# Patient Record
Sex: Male | Born: 1940 | Race: White | Hispanic: No | Marital: Married | Smoking: Former smoker
Health system: Southern US, Community
[De-identification: ages and names within clinical notes are randomized; demographics above are authoritative.]

## PROBLEM LIST (undated history)

## (undated) DIAGNOSIS — M1990 Unspecified osteoarthritis, unspecified site: Secondary | ICD-10-CM

## (undated) DIAGNOSIS — I10 Essential (primary) hypertension: Secondary | ICD-10-CM

## (undated) DIAGNOSIS — I4891 Unspecified atrial fibrillation: Secondary | ICD-10-CM

## (undated) DIAGNOSIS — Z9889 Other specified postprocedural states: Secondary | ICD-10-CM

## (undated) DIAGNOSIS — R112 Nausea with vomiting, unspecified: Secondary | ICD-10-CM

## (undated) DIAGNOSIS — N200 Calculus of kidney: Secondary | ICD-10-CM

## (undated) HISTORY — PX: CARPAL TUNNEL RELEASE: SHX101

## (undated) HISTORY — PX: OTHER SURGICAL HISTORY: SHX169

## (undated) HISTORY — DX: Unspecified atrial fibrillation: I48.91

## (undated) HISTORY — PX: INGUINAL HERNIA REPAIR: SUR1180

## (undated) HISTORY — DX: Calculus of kidney: N20.0

---

## 2004-10-27 ENCOUNTER — Other Ambulatory Visit: Admission: RE | Admit: 2004-10-27 | Discharge: 2004-10-27 | Payer: Self-pay | Admitting: Dermatology

## 2008-03-11 ENCOUNTER — Ambulatory Visit (HOSPITAL_COMMUNITY): Admission: RE | Admit: 2008-03-11 | Discharge: 2008-03-11 | Payer: Self-pay | Admitting: Urology

## 2008-03-13 ENCOUNTER — Ambulatory Visit (HOSPITAL_COMMUNITY): Admission: RE | Admit: 2008-03-13 | Discharge: 2008-03-13 | Payer: Self-pay | Admitting: Urology

## 2008-04-21 ENCOUNTER — Ambulatory Visit (HOSPITAL_COMMUNITY): Admission: RE | Admit: 2008-04-21 | Discharge: 2008-04-21 | Payer: Self-pay | Admitting: Urology

## 2008-11-26 IMAGING — CR DG ABDOMEN 1V
1 series · 1 of 1 positions shown · non-contrast
Comparison: None

CLINICAL DATA: Preop right ureteral calculus.

ABDOMEN - 1 VIEW

[view not recorded]
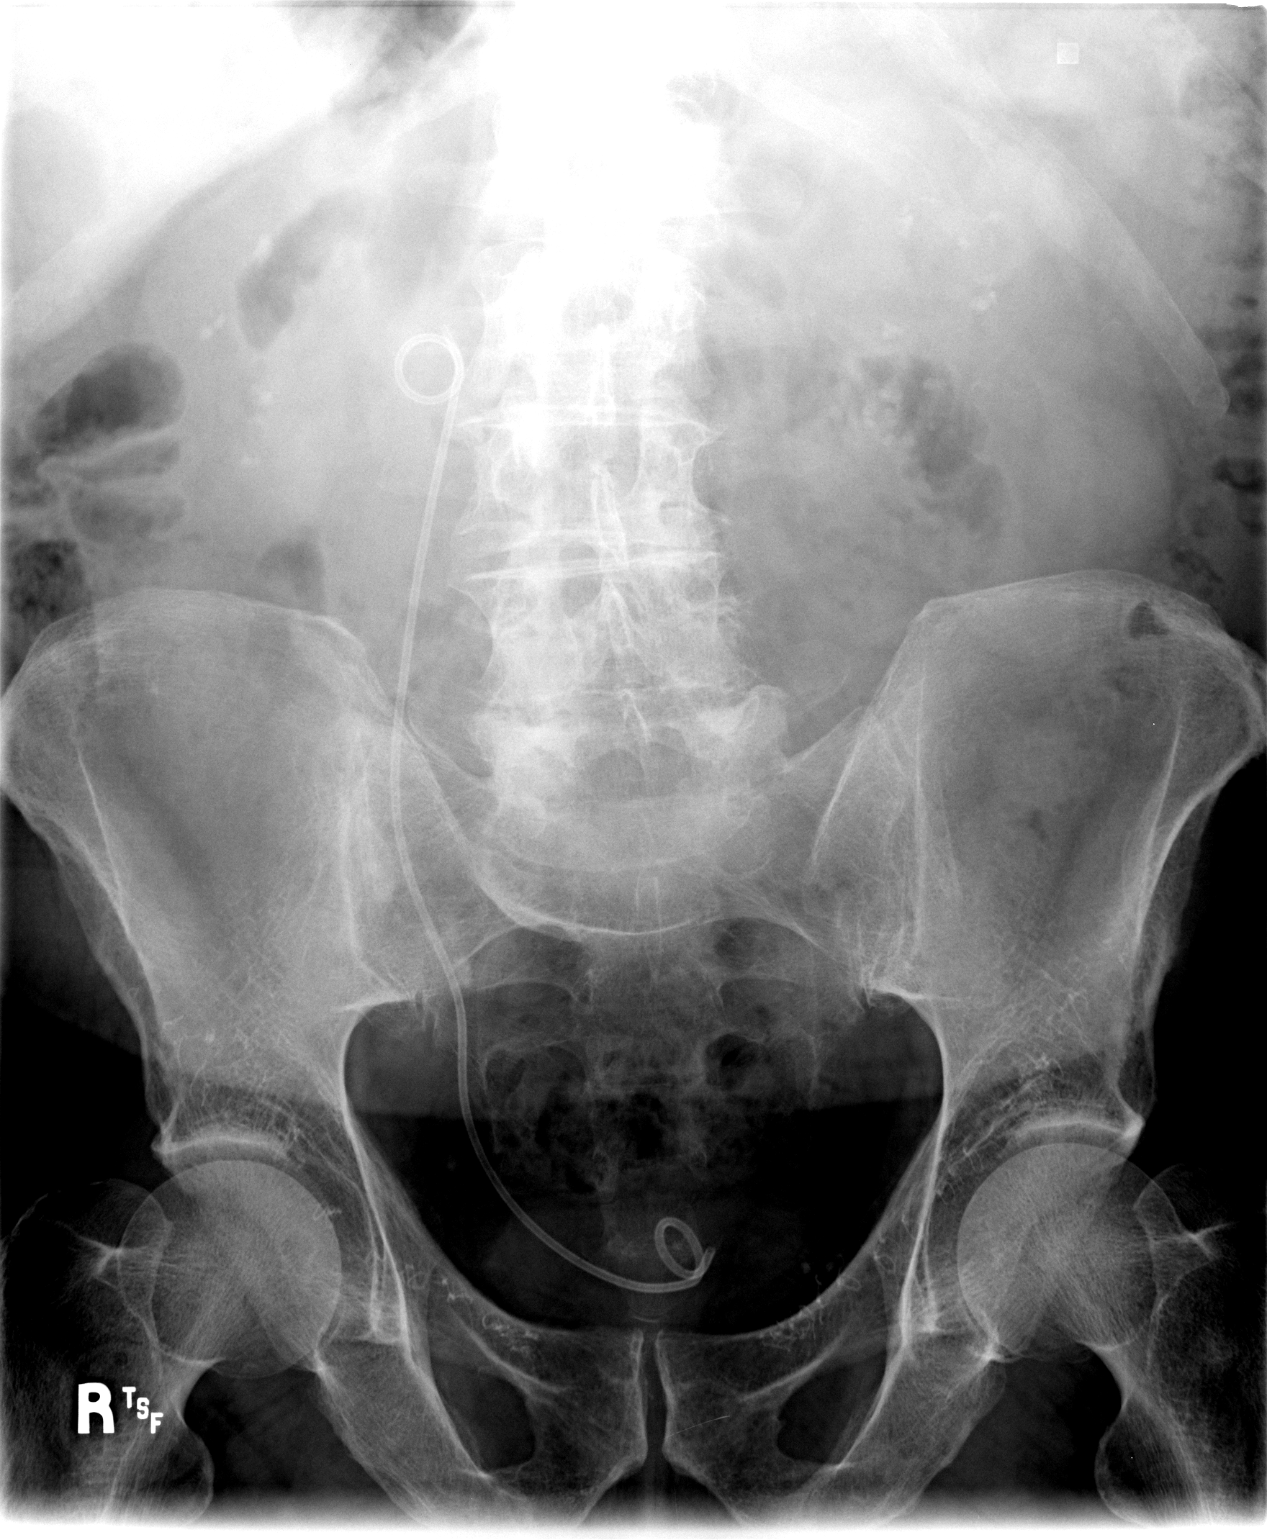

[1 of 1 positions shown; findings below may reference images not displayed]

FINDINGS: There are several radiopaque calculi projecting over the
renal outlines bilaterally.  A double-J right ureteral stent is in
place, with the proximal loop formed in the expected location of
the right renal pelvis and distal loop formed in the expected
location of the bladder.  A 9 mm calcific density is seen along the
distal aspect of the stent.  Degenerative changes are seen in the
spine.
IMPRESSION: 1.  Probable distal right ureteral calculus, with double J ureteral
stent in place.
2.  Bilateral nephrolithiasis.

## 2010-06-30 ENCOUNTER — Encounter: Payer: Self-pay | Admitting: Cardiology

## 2010-07-27 ENCOUNTER — Ambulatory Visit: Payer: Self-pay | Admitting: Cardiology

## 2010-07-27 DIAGNOSIS — I447 Left bundle-branch block, unspecified: Secondary | ICD-10-CM

## 2010-07-27 DIAGNOSIS — I4891 Unspecified atrial fibrillation: Secondary | ICD-10-CM | POA: Insufficient documentation

## 2010-07-29 ENCOUNTER — Encounter: Payer: Self-pay | Admitting: Physician Assistant

## 2010-07-29 ENCOUNTER — Ambulatory Visit: Payer: Self-pay | Admitting: Cardiology

## 2010-08-16 ENCOUNTER — Ambulatory Visit (HOSPITAL_COMMUNITY): Admission: RE | Admit: 2010-08-16 | Payer: Self-pay | Source: Home / Self Care | Admitting: Cardiology

## 2010-08-16 ENCOUNTER — Telehealth: Payer: Self-pay | Admitting: Cardiology

## 2010-08-16 ENCOUNTER — Ambulatory Visit: Admit: 2010-08-16 | Payer: Self-pay | Admitting: Cardiology

## 2010-08-22 ENCOUNTER — Ambulatory Visit (HOSPITAL_COMMUNITY)
Admission: RE | Admit: 2010-08-22 | Discharge: 2010-08-22 | Payer: Self-pay | Source: Home / Self Care | Attending: Cardiology | Admitting: Cardiology

## 2010-08-22 ENCOUNTER — Ambulatory Visit: Admission: RE | Admit: 2010-08-22 | Discharge: 2010-08-22 | Payer: Self-pay | Source: Home / Self Care

## 2010-08-29 ENCOUNTER — Encounter: Payer: Self-pay | Admitting: Cardiology

## 2010-08-31 ENCOUNTER — Encounter: Payer: Self-pay | Admitting: Cardiology

## 2010-08-31 ENCOUNTER — Ambulatory Visit: Admission: RE | Admit: 2010-08-31 | Discharge: 2010-08-31 | Payer: Self-pay | Source: Home / Self Care

## 2010-08-31 DIAGNOSIS — I428 Other cardiomyopathies: Secondary | ICD-10-CM | POA: Insufficient documentation

## 2010-09-02 ENCOUNTER — Ambulatory Visit (HOSPITAL_COMMUNITY)
Admission: RE | Admit: 2010-09-02 | Discharge: 2010-09-02 | Payer: Self-pay | Source: Home / Self Care | Admitting: Cardiology

## 2010-09-02 ENCOUNTER — Telehealth: Payer: Self-pay | Admitting: Cardiology

## 2010-09-03 NOTE — Op Note (Signed)
  Trevor Gallagher, ORILEY NO.:  [id number]  MEDICAL RECORD NO.:  [id number]          PATIENT TYPE:  OIB  LOCATION:  2899                         FACILITY:  [facility]  PHYSICIAN:  Luis Abed, MD, FACCDATE OF BIRTH:  06-25-1941  DATE OF PROCEDURE:  09/02/2010 DATE OF DISCHARGE:                              OPERATIVE REPORT   CARDIOVERSION REPORT  The patient was very carefully anticoagulated with Pradaxa.  He has atrial fibrillation.  His labs were checked and they were quite stable.  Anesthesia was present.  The patient received 16 mg of etomidate. Anterior-posterior pads were placed.  He received biphasic energy.  He was given 1 shock with 120 joules and converted to sinus rhythm.  He is waking up rapidly.  His family is here.  When he is fully awake, he will be allowed to go home and return for followup with Dr. Antoine Poche.     Luis Abed, MD, [facility]     JDK/MEDQ  D:  09/02/2010  T:  09/02/2010  Job:  [id number]  cc:   Rollene Rotunda, MD, [facility]  Electronically Signed by Willa Rough MD [facility] on 09/03/2010 09:10:38 AM

## 2010-09-15 NOTE — Miscellaneous (Signed)
  Clinical Lists Changes  Observations: Added new observation of ECHOINTERP: - Left ventricle: The cavity size was mildly dilated. There was mild       concentric hypertrophy. Systolic function was severely reduced.       The estimated ejection fraction was in the range of 25% to 30%.       Diffuse hypokinesis. The study is not technically sufficient to       allow evaluation of LV diastolic function.     - Ventricular septum: Septal motion showed abnormal function and       dyssynergy.     - Aortic valve: Mild regurgitation.     - Left atrium: The atrium was moderately dilated.     - Right atrium: The atrium was mildly dilated. (08/22/2010 15:12)      Echocardiogram  Procedure date:  08/22/2010  Findings:      - Left ventricle: The cavity size was mildly dilated. There was mild       concentric hypertrophy. Systolic function was severely reduced.       The estimated ejection fraction was in the range of 25% to 30%.       Diffuse hypokinesis. The study is not technically sufficient to       allow evaluation of LV diastolic function.     - Ventricular septum: Septal motion showed abnormal function and       dyssynergy.     - Aortic valve: Mild regurgitation.     - Left atrium: The atrium was moderately dilated.     - Right atrium: The atrium was mildly dilated.

## 2010-09-15 NOTE — Assessment & Plan Note (Signed)
Summary: [facility]   Visit Type:  Follow-up Primary Provider:  Dr. Vernon Prey  CC:  Cardiomyopathy and atrial fibrillation.  History of Present Illness: The patient presents for followup of atrial fibrillation. Following this new diagnosis I also found on echocardiography and a diagnosis of an EF of 25% with global hypokinesis. He has been taking Pradaxa since the last appointment.  He does feel an irregular rate occasionally. He denies any acute shortness of breath such as PND or orthopnea. He will get dyspneic if he does significant activities. He denies any chest discomfort, neck or arm discomfort. He has had no presyncope or syncope. He has had no weight gain or edema.  Of note Holter monitoring demonstrated persistent atrial with rates as low as 58 and as high as the 150s.  Current Medications (verified): 1)  Pradaxa 150 Mg Caps (Dabigatran Etexilate Mesylate) .... Take 1 Tablet By Mouth Two Times A Day 2)  Metoprolol Tartrate 25 Mg Tabs (Metoprolol Tartrate) .... 1/2 By Mouth Two Times A Day  Allergies (verified): No Known Drug Allergies  Past History:  Past Medical History: Nephrolithiasis Cardiomyopathy Atrial fibrillation  Past Surgical History: Reviewed history from 07/27/2010 and no changes required. Carpal Tunnel Left HNP L4 - L5 Bilateral inguinal   Family History: Reviewed history from 07/27/2010 and no changes required. No early heart disease or arrhythmias.  Social History: Patient is a Visual merchandiser though semiretired.  He is married with two children.  He had a 20 pack year smoking history though he quit in 1976.  Review of Systems       As stated in the HPI and negative for all other systems.   Vital Signs:  Patient profile:   70 year old male Height:      73 inches Weight:      226 pounds BMI:     29.92 Pulse rate:   97 / minute Resp:     16 per minute BP sitting:   120 / 88  (right arm)  Vitals Entered By: Marrion Coy, CNA (August 31, 2010 3:29 PM)  Physical Exam  General:  Well developed, well nourished, in no acute distress. Head:  normocephalic and atraumatic Eyes:  PERRLA/EOM intact; conjunctiva and lids normal. Mouth:  Upper dentures. Oral mucosa normal. Neck:  Neck supple, no JVD. No masses, thyromegaly or abnormal cervical nodes. Chest Wall:  no deformities or breast masses noted Lungs:  Clear bilaterally to auscultation and percussion. Abdomen:  Bowel sounds positive; abdomen soft and non-tender without masses, organomegaly, or hernias noted. No hepatosplenomegaly. Msk:  Back normal, normal gait. Muscle strength and tone normal. Extremities:  No clubbing or cyanosis. Neurologic:  Alert and oriented x 3. Skin:  Intact without lesions or rashes. Cervical Nodes:  no significant adenopathy Axillary Nodes:  no significant adenopathy Inguinal Nodes:  no significant adenopathy Psych:  Normal affect.   Detailed Cardiovascular Exam  Neck    Carotids: Carotids full and equal bilaterally without bruits.      Neck Veins: Normal, no JVD.    Heart    Inspection: no deformities or lifts noted.      Palpation: normal PMI with no thrills palpable.      Auscultation: irregular rate and rhythm, S1, S2 without murmurs, rubs, gallops, or clicks.    Vascular    Abdominal Aorta: no palpable masses, pulsations, or audible bruits.      Femoral Pulses: normal femoral pulses bilaterally.      Pedal Pulses: normal pedal pulses  bilaterally.      Radial Pulses: normal radial pulses bilaterally.      Peripheral Circulation: no clubbing, cyanosis, or edema noted with normal capillary refill.     Impression & Recommendations:  Problem # 1:  ATRIAL FIBRILLATION (ICD-427.31) The patient has persistent fibrillation. He has been on Pradaxa for greater than three weeks.  He will be scheduled for DCCV.   Problem # 2:  CARDIOMYOPATHY (ICD-425.4) This might be rate. He has absolutely no symptoms consistent with chest pain.  He is  very reluctant to have any therapies.  I think that he will let me increase his metorpolol to 25 mg two times a day.  I wll next try to use an ACE inhibitor.  If his EF does not improve with rhtythm or rate control he will need a cath or functional study to rule out ischemia.  Problem # 3:  LBBB (ICD-426.3) Assessment: Unchanged  His updated medication list for this problem includes:    Metoprolol Tartrate 25 Mg Tabs (Metoprolol tartrate) .Marland Kitchen... Twice a day  Patient Instructions: 1)  Your physician recommends that you schedule a follow-up appointment after your cardioversion 2)  Your physician recommends that you have lab work today:  BMP, CBC and TSH 3)  Your physician has recommended that you have a cardioversion (DCCV).  Electrical cardioversion uses a jolt of electricity to your heart either through paddles or wired patches attached to your chest. This is a controlled, usually prescheduled, procedure. Defibrillation is done under light anesthesia in the hospital, and you usually go home the day of the procedure. This is done to get your heart back into a normal rhythm. You are not awake for the procedure. Please see the instruction sheet given to you today. 4)  Your physician has recommended you make the following change in your medication: increase Metoprolol to 25 mg twice a day Prescriptions: METOPROLOL TARTRATE 25 MG TABS (METOPROLOL TARTRATE) twice a day  #60 x 6   Entered by:   Charolotte Capuchin, RN   Authorized by:   Rollene Rotunda, MD, [facility]   Signed by:   Charolotte Capuchin, RN on 08/31/2010   Method used:   [facility]        [facility] [phone]* (retail)       [address]       [place], [place]  [address]       Ph: [phone] or [phone]       Fax: [phone]   RxID:   [id number]

## 2010-09-15 NOTE — Progress Notes (Signed)
Summary: discuss monitor results  Phone Note Call from Patient Call back at Home Phone [phone]   Caller: Spouse/darlene Reason for Call: Talk to Nurse, Lab or Test Results Summary of Call: pt wife calling re monitor results Initial call taken by: Roe Coombs,  August 16, 2010 11:17 AM  Follow-up for Phone Call        called and left message that [facility] office had already reviewed with pt but to call back if he has further questions  Pt wife wish to talk to nurse here at this office Judie Grieve  August 16, 2010 4:33 PM Follow-up by: Charolotte Capuchin, RN,  August 16, 2010 3:04 PM  Additional Follow-up for Phone Call Additional follow up Details #1::        Reviewed in detail at fib and its treatments with pt's wife.  call lasted greater than 15 mins.  Appt was schedule for pt to be seen in the [place] office 08/31/2010 at 3:15 pm  to discuss treatment plan with Dr Antoine Poche at the wife's request. Additional Follow-up by: Charolotte Capuchin, RN,  August 16, 2010 5:34 PM

## 2010-09-15 NOTE — Progress Notes (Signed)
Summary: re  CARDIOVERSION  Phone Note Call from Patient Call back at Home Phone [phone]   Caller: Spouse Reason for Call: Talk to Nurse Summary of Call: pt wife wants to know what the next step and what kind of meds does the pt need. pt had a CARDIOVERSION today. Initial call taken by: Roe Coombs,  September 02, 2010 2:57 PM  Follow-up for Phone Call        follow up scheduled in [facility] office 12N 09/21/2010.  Wife aware of appt and to continue medications as ordered. Follow-up by: Charolotte Capuchin, RN,  September 02, 2010 3:08 PM

## 2010-09-15 NOTE — Procedures (Signed)
Summary: HOLTER MONITOR  HOLTER MONITOR   Imported By: Cyril Loosen, RN, BSN 08/12/2010 15:38:23  _____________________________________________________________________  External Attachment:    Type:   Image     Comment:   External Document  Appended Document: HOLTER MONITOR Left message to call back w/pt's son.  Appended Document: HOLTER MONITOR patient left message at 4:37 12/30 returning your call  patient left message at 9:28 01/03 returning your call  patient left message at 10:15 01/03  returning call  Appended Document: HOLTER MONITOR Pt notified of results and verbalized understanding. Monitor results will be sent to Dr. Antoine Poche for further review as this is a [place] pt.

## 2010-09-15 NOTE — Assessment & Plan Note (Signed)
Summary: Trevor Gallagher   Visit Type:  Follow-up Primary Provider:  Dr. Vernon Prey  CC:  Alberteen Sam Fibrillation.  History of Present Illness: The patient presents for evaluation of an abnormal EKG. He was found recently at time of hand surgery to be in atrial fibrillation. The patient does not feel this rhythm. He has not had palpitations, presyncope or syncope. He does not have lightheadedness other than mild orthostasis rarely. He denies any chest pressure, neck or arm discomfort. He's not had any shortness of breath, PND or orthopnea. He does try to stay active. He said no cardiac testing in the past.  Current Medications (verified): 1)  Aleve 220 Mg Tabs (Naproxen Sodium) .... Prn  Allergies (verified): No Known Drug Allergies  Past History:  Past Medical History: Nephrolithiasis  Past Surgical History: Carpal Tunnel Left HNP L4 - L5 Bilateral inguinal   Family History: No early heart disease or arrhythmias.  Social History: Patient is a former though semiretired.  He is married with two children.  He had a 20 pack year smoking history though he quit in 1976.  Review of Systems       Joint pain, colitis.  Otherwise negative for all other systems.  Vital Signs:  Patient profile:   70 year old male Height:      73 inches Weight:      225 pounds BMI:     29.79 Pulse rate:   62 / minute Resp:     16 per minute BP sitting:   132 / 68  (right arm)  Vitals Entered By: Marrion Coy, CNA (July 27, 2010 1:16 PM)  Physical Exam  General:  Well developed, well nourished, in no acute distress. Head:  normocephalic and atraumatic Eyes:  PERRLA/EOM intact; conjunctiva and lids normal. Mouth:  Upper dentures. Oral mucosa normal. Neck:  Neck supple, no JVD. No masses, thyromegaly or abnormal cervical nodes. Chest Wall:  no deformities or breast masses noted Lungs:  Clear bilaterally to auscultation and percussion. Abdomen:  Bowel sounds positive; abdomen soft and  non-tender without masses, organomegaly, or hernias noted. No hepatosplenomegaly. Msk:  Back normal, normal gait. Muscle strength and tone normal. Extremities:  No clubbing or cyanosis. Neurologic:  Alert and oriented x 3. Skin:  Intact without lesions or rashes. Cervical Nodes:  no significant adenopathy Axillary Nodes:  no significant adenopathy Inguinal Nodes:  no significant adenopathy Psych:  Normal affect.   Detailed Cardiovascular Exam  Neck    Carotids: Carotids full and equal bilaterally without bruits.      Neck Veins: Normal, no JVD.    Heart    Inspection: no deformities or lifts noted.      Palpation: normal PMI with no thrills palpable.      Auscultation: irregular rate and rhythm, S1, S2 without murmurs, rubs, gallops, or clicks.    Vascular    Abdominal Aorta: no palpable masses, pulsations, or audible bruits.      Femoral Pulses: normal femoral pulses bilaterally.      Pedal Pulses: normal pedal pulses bilaterally.      Radial Pulses: normal radial pulses bilaterally.      Peripheral Circulation: no clubbing, cyanosis, or edema noted with normal capillary refill.     EKG  Procedure date:  06/30/2010  Findings:      Atrial fibrillation, LBBB  Impression & Recommendations:  Problem # 1:  ATRIAL FIBRILLATION (ICD-427.31) I would like to try the patient back to normal rhythm. For that he'll need to be  on anticoagulation and I think Pradaxa will allow this to be a short course and least problematic.  I see no contraindications. I will also check a Holter monitor to make sure this is permanent atrial fibrillation. I would then bring him back in 4 weeks for elective cardioversion. Orders: Echocardiogram (Echo) Holter (Holter)  Problem # 2:  LBBB (ICD-426.3) I will check an echocardiogram. Further plans are as above.  Patient Instructions: 1)  Start Pradaxa 150mg  two times a day  2)  Your physician has requested that you have an echocardiogram.   Echocardiography is a painless test that uses sound waves to create images of your heart. It provides your doctor with information about the size and shape of your heart and how well your heart's chambers and valves are working.  This procedure takes approximately one hour. There are no restrictions for this procedure. 3)  Your physician has recommended that you wear a holter monitor.  Holter monitors are medical devices that record the heart's electrical activity. Doctors most often use these monitors to diagnose arrhythmias. Arrhythmias are problems with the speed or rhythm of the heartbeat. The monitor is a small, portable device. You can wear one while you do your normal daily activities. This is usually used to diagnose what is causing palpitations/syncope (passing out). 4)  You have been diagnosed with atrial fibrillation.  Atrial fibrillation is a condition in which one of the upper chambers of the heart has extra electrical cells causing it to beat very fast.  Please see the handout/brochure given to you today for further information. 5)  Your physician has recommended that you have a cardioversion (DCCV).  Electrical cardioversion uses a jolt of electricity to your heart either through paddles or wired patches attached to your chest. This is a controlled, usually prescheduled, procedure. Defibrillation is done under light anesthesia in the hospital, and you usually go home the day of the procedure. This is done to get your heart back into a normal rhythm. You are not awake for the procedure. Please see the instruction sheet given to you today. Prescriptions: PRADAXA 150 MG CAPS (DABIGATRAN ETEXILATE MESYLATE) Take 1 tablet by mouth two times a day  #60 x 3   Entered by:   Meredith Staggers, RN   Authorized by:   Rollene Rotunda, MD, [facility]   Signed by:   Meredith Staggers, RN on 07/27/2010   Method used:   [facility]        [facility] [phone]* (retail)       [address]        [place], [place]  [address]       Ph: [phone] or [phone]       Fax: [phone]   RxID:   [id number]    Appended Document: Flanagan Gallagher spoke w/Jenny in [place] office pt is sch to get 48 hour holter on Fri 12/16 at 9:15, pt is aware

## 2010-09-15 NOTE — Letter (Signed)
Summary: Financial planner at [facility]. [address]   [place], [place] [address]   Phone: [phone]  Fax: [phone]    Cardioversion / TEE Cardioversion Instructions  08/31/2010 MRN: [id number]  Trevor Gallagher [address] [place], [place]  [address]  Dear Trevor Gallagher, You are scheduled for a Cardioversion / TEE Cardioversion on Friday, January 20, 2012_ with Dr. Jerral Bonito   Please arrive at the [facility] at 10 a.m.  on the day of your procedure.  1)   DIET:  A)   Nothing to eat or drink after midnight except your medications with a sip of water.   B)   YOU MAY TAKE ALL of your remaining medications with a small amount of water.    2)  Must have a responsible person to drive you home.  3)   Bring a current list of your medications and current insurance cards.   * Special Note:  Every effort is made to have your procedure done on time. Occasionally there are emergencies that present themselves at the hospital that may cause delays. Please be patient if a delay does occur.  * If you have any questions after you get home, please call the office at [phone].

## 2010-09-21 ENCOUNTER — Encounter: Payer: Self-pay | Admitting: Cardiology

## 2010-09-21 ENCOUNTER — Ambulatory Visit (INDEPENDENT_AMBULATORY_CARE_PROVIDER_SITE_OTHER): Payer: Medicare Other | Admitting: Cardiology

## 2010-09-21 DIAGNOSIS — I4891 Unspecified atrial fibrillation: Secondary | ICD-10-CM

## 2010-09-29 NOTE — Assessment & Plan Note (Signed)
Summary: F/U S/P CARDIOVERSION PF, RN   Visit Type:  Follow-up Primary Provider:  Dr. Vernon Prey  CC:  Cardiomyopathy and Atrial Fibrillation.  History of Present Illness: The patient presents for followup of the above. He is status post cardioversion but unfortunately has obviously back into fibrillation. He doesn't really feel this and wouldn't kno that  he is in his rhythm.  He denies presyncope or syncope. He has had a cough and has recently been diagnosed with a pneumonia. Apparently there is an x-ray though I don't have these results. He is being treated with antibiotics and this is improving. He does apparently describe some orthopnea. He is not having any chest pressure, neck or arm discomfort. He said no weight gain or edema.  Current Medications (verified): 1)  Pradaxa 150 Mg Caps (Dabigatran Etexilate Mesylate) .... Take 1 Tablet By Mouth Two Times A Day 2)  Metoprolol Tartrate 25 Mg Tabs (Metoprolol Tartrate) .... Twice A Day 3)  Azithromycin 250 Mg Tabs (Azithromycin) .... As Directed 4)  Cefdinir 300 Mg Caps (Cefdinir) .Marland Kitchen.. 1 By Mouth Two Times A Day  Allergies (verified): No Known Drug Allergies  Past History:  Past Medical History: Nephrolithiasis Cardiomyopathy (25%) Atrial fibrillation (recurrent after DC cardioversion)  Past Surgical History: Reviewed history from 07/27/2010 and no changes required. Carpal Tunnel Left HNP L4 - L5 Bilateral inguinal   Review of Systems       As stated in the HPI and negative for all other systems.   Vital Signs:  Patient profile:   70 year old male Height:      73 inches Weight:      222 pounds BMI:     29.40 Pulse rate:   93 / minute Resp:     16 per minute BP sitting:   130 / 102  (right arm)  Vitals Entered By: Marrion Coy, CNA (September 21, 2010 12:14 PM)  Physical Exam  General:  Well developed, well nourished, in no acute distress. Head:  normocephalic and atraumatic Eyes:  PERRLA/EOM intact; conjunctiva  and lids normal. Mouth:  Upper dentures. Oral mucosa normal. Neck:  Neck supple, no JVD. No masses, thyromegaly or abnormal cervical nodes. Chest Wall:  no deformities or breast masses noted Lungs:  Clear bilaterally to auscultation and percussion. Abdomen:  Bowel sounds positive; abdomen soft and non-tender without masses, organomegaly, or hernias noted. No hepatosplenomegaly. Msk:  Back normal, normal gait. Muscle strength and tone normal. Extremities:  No clubbing or cyanosis. Neurologic:  Alert and oriented x 3. Skin:  Intact without lesions or rashes. Cervical Nodes:  no significant adenopathy Axillary Nodes:  no significant adenopathy Inguinal Nodes:  no significant adenopathy Psych:  Normal affect.   Detailed Cardiovascular Exam  Neck    Carotids: Carotids full and equal bilaterally without bruits.      Neck Veins: Normal, no JVD.    Heart    Inspection: no deformities or lifts noted.      Palpation: normal PMI with no thrills palpable.      Auscultation: irregular rate and rhythm, S1, S2 without murmurs, rubs, gallops, or clicks.    Vascular    Abdominal Aorta: no palpable masses, pulsations, or audible bruits.      Femoral Pulses: normal femoral pulses bilaterally.      Pedal Pulses: normal pedal pulses bilaterally.      Radial Pulses: normal radial pulses bilaterally.      Peripheral Circulation: no clubbing, cyanosis, or edema noted with normal capillary  refill.     Impression & Recommendations:  Problem # 1:  CARDIOMYOPATHY (ICD-425.4) Today underwent to titrate meds to further manage his cardiomyopathy. The etiology is still not clear and he will likely need an ischemia workup if he will allow me in the future. I am going to change from metoprolol to carvedilol 3.125 mg b.i.d. and lisinopril 5 mg daily.  I told him he will need frequent visits and titrations.  Problem # 2:  ATRIAL FIBRILLATION (ICD-427.31) For now he will remain on Pradaxa and we will try for  rate control.  He might need Tikosyn in the future and another cardioversion.  However, he is very reluctant to proceed with these therapies. Orders: EKG w/ Interpretation (93000)  Problem # 3:  LBBB (ICD-426.3) This is chronic. Orders: EKG w/ Interpretation (93000)  Patient Instructions: 1)  Your physician recommends that you schedule a follow-up appointment in: 2 weeks with Dr Antoine Poche 2)  Your physician recommends that you return for lab work in:  2 weeks BMP  427.31 428.22 3)  Your physician has recommended you make the following change in your medication: Sto[p Metoprolol and start Carvedilol 3.125 mg two times a day and Lisinopril 5 mg once Prescriptions: LISINOPRIL 5 MG TABS (LISINOPRIL) once daily  #30 x 6   Entered by:   Charolotte Capuchin, RN   Authorized by:   Rollene Rotunda, MD, [facility]   Signed by:   Charolotte Capuchin, RN on 09/21/2010   Method used:   [facility] to        [facility] [phone]* (retail)       [address]       [place], [place]  [address]       Ph: [phone] or [phone]       Fax: [phone]   RxID:   [id number] COREG 3.125 MG TABS (CARVEDILOL) one two times a day  #60 x 3   Entered by:   Charolotte Capuchin, RN   Authorized by:   Rollene Rotunda, MD, [facility]   Signed by:   Charolotte Capuchin, RN on 09/21/2010   Method used:   [facility]ically to        [facility] [phone]* (retail)       [address]       [place], [place]  [address]       Ph: [phone] or [phone]       Fax: [phone]   RxID:   [id number]  I have reviewed and approved all prescriptions at the time of this visit. Rollene Rotunda, MD, [facility]  September 21, 2010 1:10 PM

## 2010-10-07 ENCOUNTER — Encounter: Payer: Self-pay | Admitting: Cardiology

## 2010-10-12 ENCOUNTER — Ambulatory Visit (INDEPENDENT_AMBULATORY_CARE_PROVIDER_SITE_OTHER): Payer: Medicare Other | Admitting: Cardiology

## 2010-10-12 ENCOUNTER — Encounter: Payer: Self-pay | Admitting: Cardiology

## 2010-10-12 DIAGNOSIS — I4891 Unspecified atrial fibrillation: Secondary | ICD-10-CM

## 2010-10-12 DIAGNOSIS — I5022 Chronic systolic (congestive) heart failure: Secondary | ICD-10-CM

## 2010-10-14 ENCOUNTER — Encounter (INDEPENDENT_AMBULATORY_CARE_PROVIDER_SITE_OTHER): Payer: Self-pay | Admitting: *Deleted

## 2010-10-20 NOTE — Letter (Signed)
Summary: Appointment - Reschedule  [facility], Main Office  [address]   [place], [place] [address]   Phone: [phone]  Fax: [phone]     October 14, 2010 MRN: [id number]   SEVERO BEBER [address] [place], [place]  [address]   Dear Mr. ESCHER,   Due to a change in our office schedule, your appointment on  11-16-2010                      at   1:30 p.m.             must be changed.  It is very important that we reach you to reschedule this appointment. We look forward to participating in your health care needs. Please contact us at the number listed above at your earliest convenience to reschedule this appointment.     Sincerely,      Lorne Skeens  [facility] Scheduling Team

## 2010-10-20 NOTE — Assessment & Plan Note (Signed)
Summary: 2 weeks with Dr Antoine Poche   Visit Type:  Follow-up Primary Provider:  Dr. Vernon Prey  CC:  Cardiomyopathy.  History of Present Illness: The patient presents for evaluation of cardiomyopathy and atrial fibrillation. At the last appointment he was back in fibrillation after cardioversion. I switched him to carvedilol and lisinopril. He tolerated these. He has not had any new lightheadedness, presyncope or syncope. He's not had any new chest pressure, neck or arm discomfort. He has not had any new palpitations. He denies any shortness of breath, PND or orthopnea.  Current Medications (verified): 1)  Pradaxa 150 Mg Caps (Dabigatran Etexilate Mesylate) .... Take 1 Tablet By Mouth Two Times A Day 2)  Azithromycin 250 Mg Tabs (Azithromycin) .... As Directed 3)  Coreg 3.125 Mg Tabs (Carvedilol) .... One Two Times A Day 4)  Lisinopril 5 Mg Tabs (Lisinopril) .... Once Daily  Allergies (verified): No Known Drug Allergies  Past History:  Past Medical History: Reviewed history from 09/21/2010 and no changes required. Nephrolithiasis Cardiomyopathy (25%) Atrial fibrillation (recurrent after DC cardioversion)  Past Surgical History: Reviewed history from 07/27/2010 and no changes required. Carpal Tunnel Left HNP L4 - L5 Bilateral inguinal   Review of Systems       As stated in the HPI and negative for all other systems.   Vital Signs:  Patient profile:   70 year old male Height:      73 inches Weight:      224 pounds BMI:     29.66 Pulse rate:   79 / minute Resp:     16 per minute BP sitting:   114 / 76  (right arm)  Vitals Entered By: Marrion Coy, CNA (October 12, 2010 1:24 PM)  Physical Exam  General:  Well developed, well nourished, in no acute distress. Head:  normocephalic and atraumatic Eyes:  PERRLA/EOM intact; conjunctiva and lids normal. Mouth:  Upper dentures. Oral mucosa normal. Neck:  Neck supple, no JVD. No masses, thyromegaly or abnormal cervical  nodes. Chest Wall:  no deformities or breast masses noted Lungs:  Clear bilaterally to auscultation and percussion. Abdomen:  Bowel sounds positive; abdomen soft and non-tender without masses, organomegaly, or hernias noted. No hepatosplenomegaly. Msk:  Back normal, normal gait. Muscle strength and tone normal. Extremities:  No clubbing or cyanosis. Neurologic:  Alert and oriented x 3. Skin:  Intact without lesions or rashes. Cervical Nodes:  no significant adenopathy Inguinal Nodes:  no significant adenopathy Psych:  Normal affect.   Detailed Cardiovascular Exam  Neck    Carotids: Carotids full and equal bilaterally without bruits.      Neck Veins: Normal, no JVD.    Heart    Inspection: no deformities or lifts noted.      Palpation: normal PMI with no thrills palpable.      Auscultation: irregular rate and rhythm, S1, S2 without murmurs, rubs, gallops, or clicks.    Vascular    Abdominal Aorta: no palpable masses, pulsations, or audible bruits.      Femoral Pulses: normal femoral pulses bilaterally.      Pedal Pulses: normal pedal pulses bilaterally.      Radial Pulses: normal radial pulses bilaterally.      Peripheral Circulation: no clubbing, cyanosis, or edema noted with normal capillary refill.     Impression & Recommendations:  Problem # 1:  CARDIOMYOPATHY (ICD-425.4) His management is complicated by his significant grumpiness.  However, I think that I am slowing working my way through this.  He  does agree to take a higher dose of carvedilol. In the future I would like to try to talk him into the hospital where I could try Tikosyn and another DCCV.  Problem # 2:  ATRIAL FIBRILLATION (ICD-427.31) He remains on Pradaxa and I wil continue the meds as listed.  Problem # 3:  LBBB (ICD-426.3) Assessment: Unchanged  Patient Instructions: 1)  Your physician recommends that you schedule a follow-up appointment in: one month 2)  Your physician has recommended you make the  following change in your medication: Increase carvedilol to 6.25 mg two times a day Prescriptions: CARVEDILOL 6.25 MG TABS (CARVEDILOL) one two times a day  #60 x 6   Entered by:   Charolotte Capuchin, RN   Authorized by:   Rollene Rotunda, MD, [facility]   Signed by:   Charolotte Capuchin, RN on 10/12/2010   Method used:   Electronically to        [facility] [phone]* (retail)       [address]       [place], [place]  [address]       Ph: [phone] or [phone]       Fax: [phone]   RxID:   [id number]

## 2010-11-05 ENCOUNTER — Encounter: Payer: Self-pay | Admitting: Cardiology

## 2010-11-16 ENCOUNTER — Ambulatory Visit: Payer: Medicare Other | Admitting: Cardiology

## 2010-11-30 ENCOUNTER — Encounter: Payer: Self-pay | Admitting: Cardiology

## 2010-11-30 ENCOUNTER — Ambulatory Visit (INDEPENDENT_AMBULATORY_CARE_PROVIDER_SITE_OTHER): Payer: Medicare Other | Admitting: Cardiology

## 2010-11-30 VITALS — BP 151/90 | HR 80 | Ht 73.0 in | Wt 222.0 lb

## 2010-11-30 DIAGNOSIS — I428 Other cardiomyopathies: Secondary | ICD-10-CM

## 2010-11-30 DIAGNOSIS — N200 Calculus of kidney: Secondary | ICD-10-CM | POA: Insufficient documentation

## 2010-11-30 DIAGNOSIS — I10 Essential (primary) hypertension: Secondary | ICD-10-CM | POA: Insufficient documentation

## 2010-11-30 DIAGNOSIS — I4891 Unspecified atrial fibrillation: Secondary | ICD-10-CM

## 2010-11-30 DIAGNOSIS — I447 Left bundle-branch block, unspecified: Secondary | ICD-10-CM

## 2010-11-30 MED ORDER — DABIGATRAN ETEXILATE MESYLATE 150 MG PO CAPS
150.0000 mg | ORAL_CAPSULE | Freq: Two times a day (BID) | ORAL | Status: DC
Start: 2010-11-30 — End: 2013-04-11

## 2010-11-30 MED ORDER — OXYCODONE-ACETAMINOPHEN 7.5-500 MG PO TABS: 1.0000 | ORAL_TABLET | Freq: Two times a day (BID) | ORAL | Status: AC | PRN

## 2010-11-30 MED ORDER — LISINOPRIL 10 MG PO TABS
10.0000 mg | ORAL_TABLET | Freq: Every day | ORAL | Status: DC
Start: 2010-11-30 — End: 2011-11-17

## 2010-11-30 MED ORDER — CARVEDILOL 3.125 MG PO TABS: 3.1250 mg | ORAL_TABLET | Freq: Two times a day (BID) | ORAL | Status: DC

## 2010-11-30 NOTE — Assessment & Plan Note (Signed)
I will increase his lisinopril to 10 mg daily. He is very hesitant to have medication changes but I think he's been compliant.

## 2010-11-30 NOTE — Assessment & Plan Note (Signed)
I have encouraged him to see his urologist or primary provider but he will not. I will give him a short course of pain medication.

## 2010-11-30 NOTE — Progress Notes (Signed)
HPI The patient presents for followup of cardiomyopathy and atrial fibrillation. Since I last saw him he has developed a kidney stone which he had done several times in the past. He refuses to see a doctor and says this will pass. He is having some discomfort as he usually does with this though he is not noticing any hematuria. He has had some dizziness and lightheadedness since I increased his carvedilol he has not had frank syncope. He denies any new shortness of breath, PND or cough. He has had no palpitations, presyncope or syncope. He has had no chest pressure, neck or arm discomfort. He has had no weight gain or edema.  No Known Allergies  Current Outpatient Prescriptions  Medication Sig Dispense Refill  . carvedilol (COREG) 6.25 MG tablet Take 6.25 mg by mouth 2 (two) times daily with a meal.        . dabigatran (PRADAXA) 150 MG CAPS Take 150 mg by mouth every 12 (twelve) hours.        Marland Kitchen lisinopril (PRINIVIL,ZESTRIL) 5 MG tablet Take 5 mg by mouth daily.          Past Medical History  Diagnosis Date  . Nephrolithiasis   . Cardiomyopathy   . Atrial fibrillation     s/p cardioversion -- recurrent    Past Surgical History  Procedure Date  . Carpal tunnel release     left  . Inguinal hernia repair     bilateral  . Herniated nucleus pulposus     L4-L5   ROS: As stated in the HPI and negative for all other systems.  PHYSICAL EXAM BP 151/90  Pulse 80  Ht 6\' 1"  (1.854 m)  Wt 222 lb (100.699 kg)  BMI 29.29 kg/m2 GENERAL:  Well appearing HEENT:  Pupils equal round and reactive, fundi not visualized, oral mucosa unremarkable, edentulous NECK:  No jugular venous distention, waveform within normal limits, carotid upstroke brisk and symmetric, no bruits, no thyromegaly LYMPHATICS:  No cervical, inguinal adenopathy LUNGS:  Clear to auscultation bilaterally BACK:  No CVA tenderness CHEST:  Unremarkable HEART:  PMI not displaced or sustained,S1 and S2 within normal limits, no S3, no  S4, no clicks, no rubs, no murmurs, irregular ABD:  Flat, positive bowel sounds normal in frequency in pitch, no bruits, no rebound, no guarding, no midline pulsatile mass, no hepatomegaly, no splenomegaly EXT:  2 plus pulses throughout, no edema, no cyanosis no clubbing SKIN:  No rashes no nodules NEURO:  Cranial nerves II through XII grossly intact, motor grossly intact throughout PSYCH:  Cognitively intact, oriented to person place and time  EKG:  Atrial fibrillation, left bundle branch block, premature ventricular contractions, slow ventricular rate  ASSESSMENT AND PLAN

## 2010-11-30 NOTE — Assessment & Plan Note (Addendum)
It has been difficult to convince him to his medications and the risk of stroke I think she needs to be compliant with Pradaxa.  He understands if he sees any hematuria needs to let me know and I would probably hold throughout. We will get a CBC today. There have been no bleeding issues or complications yet. I would like to think about cardioversion with Tikosyn in in the future but I don't think he'll allow this. He did not maintain sinus rhythm after his last cardioversion.

## 2010-11-30 NOTE — Assessment & Plan Note (Signed)
I don't think he is tolerating the increased carvedilol and I will switch to 3.125 b.i.d. I will titrate his ACE inhibitor as below.

## 2010-11-30 NOTE — Patient Instructions (Signed)
Increase Lisinopril to 10 mg  Daily Decrease Carvedilol to 3.125 mg twice a day Have lab work drawn now (CBC) You may take oxycodone twice a day for pain Follow up with Dr Antoine Poche in [place] in 6 weeks

## 2010-12-01 ENCOUNTER — Encounter: Payer: Self-pay | Admitting: Cardiology

## 2010-12-27 NOTE — H&P (Signed)
NAMEJEFTE, Trevor Gallagher                ACCOUNT NO.:  [id number]   MEDICAL RECORD NO.:  [id number]          PATIENT TYPE:  AMB   LOCATION:  DAY                           FACILITY:  [facility]   PHYSICIAN:  Dennie Maizes, M.D.   DATE OF BIRTH:  1941/02/22   DATE OF ADMISSION:  03/11/2008  DATE OF DISCHARGE:  LH                              HISTORY & PHYSICAL   CHIEF COMPLAINT:  Right distal ureteral calculus with obstruction, post  right ureteral stent placement.   HISTORY OF PRESENT ILLNESS:  A 70 year old male with a past history of  recurrent urolithiasis.  He had undergone lithotripsy about 5 years ago.  He experienced intermittent severe right flank pain radiating to the  front last week.  He came to the emergency room at [facility]  A noncontrast CT scan of the abdomen and pelvis revealed  bilateral nonobstructing renal calculi.  There was a large stone in the  right distal ureteral calculus measuring about 2.1 cm in size with  moderate hydronephrosis.  Another stone 9 mm in size in the left distal  ureter with obstruction.  The patient has undergone cystoscopy, left  retrograde pyelograms, left ureteroscopy stone extraction and bilateral  ureteral stent placement.  The left ureteral stent has since been  removed.  The patient has been voiding well.  He denied having any  fever, chills or hematuria at present.  He was brought to the short-stay  center today for extracorporeal shockwave lithotripsy of obstructing  large right distal ureteral calculus.   PAST MEDICAL HISTORY:  History of recurrent urolithiasis, status post  ESL, status post left ureteroscopy stone extraction last week, status  post hernia repair, status post back surgery x2, status post  tonsillectomy, history of arthritis.   MEDICATIONS:  Cipro and Percocet.   ALLERGIES:  NONE.   PHYSICAL EXAMINATION:  HEAD, EYES, EARS, NOSE AND THROAT:  Normal.  LUNGS:  Clear to auscultation.  HEART:  Regular rate  and rhythm.  No murmurs.  ABDOMEN:  Soft.  No palpable flank mass or CVA tenderness.  Bladder not  palpable.  Penis and testes are normal.   IMPRESSION:  Right distal ureteral calculus with obstruction (2.1 cm),  post right ureteral stent placement.   PLAN:  ESL of right distal ureteral calculus with IV sedation in the  short-stay center.  I discussed with the patient regarding diagnosis,  operative details, alternative treatment and possible risks and  complications and he has agreed for the procedure to be done.  In view  of the large size of the stone, he may need more than one session of  treatment.  This has been explained to the patient.      Dennie Maizes, M.D.  Electronically Signed     SK/MEDQ  D:  03/11/2008  T:  03/11/2008  Job:  [id number]   cc:   Short Stay Center

## 2011-01-11 ENCOUNTER — Encounter: Payer: Self-pay | Admitting: Cardiology

## 2011-01-11 ENCOUNTER — Ambulatory Visit (INDEPENDENT_AMBULATORY_CARE_PROVIDER_SITE_OTHER): Payer: Medicare Other | Admitting: Cardiology

## 2011-01-11 DIAGNOSIS — I428 Other cardiomyopathies: Secondary | ICD-10-CM

## 2011-01-11 DIAGNOSIS — I4891 Unspecified atrial fibrillation: Secondary | ICD-10-CM

## 2011-01-11 DIAGNOSIS — I10 Essential (primary) hypertension: Secondary | ICD-10-CM

## 2011-01-11 NOTE — Assessment & Plan Note (Signed)
He tolerates blood thinner and rate control.

## 2011-01-11 NOTE — Assessment & Plan Note (Signed)
I don't think that his BP will tolerate increased meds.  I will continue with the current meds. I will see him again in 4 months and check an echocardiogram.

## 2011-01-11 NOTE — Progress Notes (Signed)
HPI The patient presents for followup of cardiomyopathy and atrial fibrillation. Since I last saw him he has passed a kidney stone.  He had no bleeding or other problems with this.  He was complaining of increased dizziness and so at the last appointment I reduced his beta blocker and increased his ACE inhibitor. He has done well with this. 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 With this he denies any presyncope or syncope. He doesn't notice any palpitations. He has had chest pressure, neck or arm discomfort. He has had no shortness of breath, PND or orthopnea. He has had no edema.   No Known Allergies  Current Outpatient Prescriptions  Medication Sig Dispense Refill  . carvedilol (COREG) 3.125 MG tablet Take 1 tablet (3.125 mg total) by mouth 2 (two) times daily.  60 tablet  11  . dabigatran (PRADAXA) 150 MG CAPS Take 1 capsule (150 mg total) by mouth every 12 (twelve) hours.  60 capsule  11  . lisinopril (PRINIVIL,ZESTRIL) 10 MG tablet Take 1 tablet (10 mg total) by mouth daily.  30 tablet  11  . oxyCODONE-acetaminophen (PERCOCET) 7.5-500 MG per tablet Take 1 tablet by mouth 2 (two) times daily as needed for pain.  60 tablet  0    Past Medical History  Diagnosis Date  . Nephrolithiasis   . Cardiomyopathy   . Atrial fibrillation     s/p cardioversion -- recurrent    Past Surgical History  Procedure Date  . Carpal tunnel release     left  . Inguinal hernia repair     bilateral  . Herniated nucleus pulposus     L4-L5   ROS: As stated in the HPI and negative for all other systems.  PHYSICAL EXAM BP 110/78  Pulse 78  Resp 16  Ht 6\' 1"  (1.854 m)  Wt 222 lb (100.699 kg)  BMI 29.29 kg/m2 GENERAL:  Well appearing HEENT:  Pupils equal round and reactive, fundi not visualized, oral mucosa unremarkable, edentulous NECK:  No jugular venous distention, waveform within normal limits, carotid upstroke brisk and symmetric, no bruits, no thyromegaly LYMPHATICS:  No cervical, inguinal  adenopathy LUNGS:  Clear to auscultation bilaterally BACK:  No CVA tenderness CHEST:  Unremarkable HEART:  PMI not displaced or sustained,S1 and S2 within normal limits, no S3, no clicks, no rubs, no murmurs, irregular ABD:  Flat, positive bowel sounds normal in frequency in pitch, no bruits, no rebound, no guarding, no midline pulsatile mass, no hepatomegaly, no splenomegaly EXT:  2 plus pulses throughout, no edema, no cyanosis no clubbing SKIN:  No rashes no nodules NEURO:  Cranial nerves II through XII grossly intact, motor grossly intact throughout PSYCH:  Cognitively intact, oriented to person place and time   ASSESSMENT AND PLAN

## 2011-01-11 NOTE — Patient Instructions (Signed)
Continue current medications as listed Follow up with Dr Antoine Poche in [place] in 4 months

## 2011-01-11 NOTE — Assessment & Plan Note (Signed)
His blood pressure is now running low and being managed in the context of treating his cardiomyopathy.

## 2011-05-01 ENCOUNTER — Encounter: Payer: Self-pay | Admitting: Cardiology

## 2011-05-03 ENCOUNTER — Ambulatory Visit (INDEPENDENT_AMBULATORY_CARE_PROVIDER_SITE_OTHER): Payer: Medicare Other | Admitting: Cardiology

## 2011-05-03 ENCOUNTER — Encounter: Payer: Self-pay | Admitting: Cardiology

## 2011-05-03 DIAGNOSIS — I428 Other cardiomyopathies: Secondary | ICD-10-CM

## 2011-05-03 DIAGNOSIS — I10 Essential (primary) hypertension: Secondary | ICD-10-CM

## 2011-05-03 NOTE — Patient Instructions (Addendum)
Your physician has requested that you have an echocardiogram. Echocardiography is a painless test that uses sound waves to create images of your heart. It provides your doctor with information about the size and shape of your heart and how well your heart's chambers and valves are working. This procedure takes approximately one hour. There are no restrictions for this procedure.  This will be done at the [facility] office.  The current medical regimen is effective;  continue present plan and medications.  Follow up in 6 months with Dr Antoine Poche in [place].  You will receive a letter in the mail 2 months before you are due.  Please call us when you receive this letter to schedule your follow up appointment.

## 2011-05-03 NOTE — Progress Notes (Signed)
HPI The patient presents for followup of cardiomyopathy and atrial fibrillation. Since I last saw him he has done well.  He is getting in the hay now.  He denies any presyncope or syncope. He doesn't notice any palpitations. He has had chest pressure, neck or arm discomfort. He has had no shortness of breath, PND or orthopnea. He has had no edema.  Overall he thinks that he feels better than he used to    No Known Allergies  Current Outpatient Prescriptions  Medication Sig Dispense Refill  . carvedilol (COREG) 6.25 MG tablet Take 6.25 mg by mouth 2 (two) times daily with a meal.        . dabigatran (PRADAXA) 150 MG CAPS Take 1 capsule (150 mg total) by mouth every 12 (twelve) hours.  60 capsule  11  . lisinopril (PRINIVIL,ZESTRIL) 10 MG tablet Take 1 tablet (10 mg total) by mouth daily.  30 tablet  11  . oxyCODONE-acetaminophen (PERCOCET) 7.5-500 MG per tablet Take 1 tablet by mouth 2 (two) times daily as needed for pain.  60 tablet  0    Past Medical History  Diagnosis Date  . Nephrolithiasis   . Cardiomyopathy   . Atrial fibrillation     s/p cardioversion -- recurrent    Past Surgical History  Procedure Date  . Carpal tunnel release     left  . Inguinal hernia repair     bilateral  . Herniated nucleus pulposus     L4-L5   ROS: As stated in the HPI and negative for all other systems.  PHYSICAL EXAM BP 108/80  Pulse 59  Resp 16  Ht 6\' 1"  (1.854 m)  Wt 225 lb (102.059 kg)  BMI 29.69 kg/m2 GENERAL:  Well appearing HEENT:  Pupils equal round and reactive, fundi not visualized, oral mucosa unremarkable, edentulous NECK:  No jugular venous distention, waveform within normal limits, carotid upstroke brisk and symmetric, no bruits, no thyromegaly LYMPHATICS:  No cervical, inguinal adenopathy LUNGS:  Clear to auscultation bilaterally BACK:  No CVA tenderness CHEST:  Unremarkable HEART:  PMI not displaced or sustained,S1 and S2 within normal limits, no S3, no clicks, no rubs, no  murmurs, irregular ABD:  Flat, positive bowel sounds normal in frequency in pitch, no bruits, no rebound, no guarding, no midline pulsatile mass, no hepatomegaly, no splenomegaly EXT:  2 plus pulses throughout, no edema, no cyanosis no clubbing SKIN:  No rashes no nodules NEURO:  Cranial nerves II through XII grossly intact, motor grossly intact throughout PSYCH:  Cognitively intact, oriented to person place and time   EKG:  Atrial fibrillation with LBBB  ASSESSMENT AND PLAN

## 2011-05-03 NOTE — Assessment & Plan Note (Signed)
The blood pressure is at target. No change in medications is indicated. We will continue with therapeutic lifestyle changes (TLC).  

## 2011-05-03 NOTE — Assessment & Plan Note (Signed)
At this point I will not titrate his meds.  I will check an echo to see if his EF is improved now that we are on stable medications.

## 2011-05-03 NOTE — Assessment & Plan Note (Signed)
He tolerates this rhythm and rate control and anticoagulation. We will continue with the meds as listed.  

## 2011-05-11 ENCOUNTER — Other Ambulatory Visit (INDEPENDENT_AMBULATORY_CARE_PROVIDER_SITE_OTHER): Payer: Medicare Other | Admitting: *Deleted

## 2011-05-11 DIAGNOSIS — I10 Essential (primary) hypertension: Secondary | ICD-10-CM

## 2011-05-11 DIAGNOSIS — I428 Other cardiomyopathies: Secondary | ICD-10-CM

## 2011-05-11 DIAGNOSIS — I4891 Unspecified atrial fibrillation: Secondary | ICD-10-CM

## 2011-05-24 ENCOUNTER — Telehealth: Payer: Self-pay | Admitting: Cardiology

## 2011-05-24 MED ORDER — CARVEDILOL 6.25 MG PO TABS: 6.2500 mg | ORAL_TABLET | Freq: Two times a day (BID) | ORAL | Status: DC

## 2011-05-24 NOTE — Telephone Encounter (Signed)
Coreg 6.25 refill to [facility]

## 2011-07-27 ENCOUNTER — Telehealth: Payer: Self-pay | Admitting: Cardiology

## 2011-07-27 NOTE — Telephone Encounter (Signed)
Close  

## 2011-10-09 ENCOUNTER — Other Ambulatory Visit: Payer: Self-pay | Admitting: Cardiology

## 2011-10-10 ENCOUNTER — Other Ambulatory Visit: Payer: Self-pay | Admitting: *Deleted

## 2011-10-25 ENCOUNTER — Encounter: Payer: Self-pay | Admitting: Cardiology

## 2011-10-25 ENCOUNTER — Ambulatory Visit (INDEPENDENT_AMBULATORY_CARE_PROVIDER_SITE_OTHER): Payer: Medicare Other | Admitting: Cardiology

## 2011-10-25 VITALS — BP 130/70 | HR 70 | Ht 73.0 in | Wt 228.0 lb

## 2011-10-25 DIAGNOSIS — I428 Other cardiomyopathies: Secondary | ICD-10-CM

## 2011-10-25 DIAGNOSIS — I4891 Unspecified atrial fibrillation: Secondary | ICD-10-CM

## 2011-10-25 DIAGNOSIS — I10 Essential (primary) hypertension: Secondary | ICD-10-CM

## 2011-10-25 NOTE — Assessment & Plan Note (Signed)
His ejection fraction was slightly improved previously. 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 No change in therapy is indicated. His heart rate would not allow titration of medications and

## 2011-10-25 NOTE — Patient Instructions (Signed)
Continue current medications as listed.  Follow up in 1 year with Dr Hochrein.  You will receive a letter in the mail 2 months before you are due.  Please call us when you receive this letter to schedule your follow up appointment.  

## 2011-10-25 NOTE — Assessment & Plan Note (Signed)
He tolerates rate control and anticoagulation. No change in therapy is indicated. 

## 2011-10-25 NOTE — Assessment & Plan Note (Signed)
His blood pressure is treated in the context of managing his cardiomyopathy.

## 2011-10-25 NOTE — Progress Notes (Signed)
   HPI The patient presents for followup of atrial fibrillation and a nonischemic cardiomyopathy. Since I last saw him he has had no new cardiovascular testing. He says he is breathing well and describes Class 1 symptoms.  The patient denies any new symptoms such as chest discomfort, neck or arm discomfort. There has been no new shortness of breath, PND or orthopnea. There have been no reported palpitations, presyncope or syncope.  He works very hard without significant limitations.   No Known Allergies  Current Outpatient Prescriptions  Medication Sig Dispense Refill  . carvedilol (COREG) 6.25 MG tablet TAKE 1 TABLET BY MOUTH 2 TIMES A DAY WITH A MEAL  60 tablet  3  . dabigatran (PRADAXA) 150 MG CAPS Take 1 capsule (150 mg total) by mouth every 12 (twelve) hours.  60 capsule  11  . lisinopril (PRINIVIL,ZESTRIL) 10 MG tablet Take 1 tablet (10 mg total) by mouth daily.  30 tablet  11  . oxyCODONE-acetaminophen (PERCOCET) 7.5-500 MG per tablet Take 1 tablet by mouth 2 (two) times daily as needed for pain.  60 tablet  0    Past Medical History  Diagnosis Date  . Nephrolithiasis   . Cardiomyopathy   . Atrial fibrillation     s/p cardioversion -- recurrent    Past Surgical History  Procedure Date  . Carpal tunnel release     left  . Inguinal hernia repair     bilateral  . Herniated nucleus pulposus     L4-L5   ROS: As stated in the HPI and negative for all other systems.  PHYSICAL EXAM BP 130/70  Pulse 70  Ht 6\' 1"  (1.854 m)  Wt 228 lb (103.42 kg)  BMI 30.08 kg/m2 GENERAL:  Well appearing HEENT:  Pupils equal round and reactive, fundi not visualized, oral mucosa unremarkable, edentulous NECK:  No jugular venous distention, waveform within normal limits, carotid upstroke brisk and symmetric, no bruits, no thyromegaly LYMPHATICS:  No cervical, inguinal adenopathy LUNGS:  Clear to auscultation bilaterally BACK:  No CVA tenderness CHEST:  Unremarkable HEART:  PMI not displaced or  sustained,S1 and S2 within normal limits, no S3, no clicks, no rubs, no murmurs, irregular ABD:  Flat, positive bowel sounds normal in frequency in pitch, no bruits, no rebound, no guarding, no midline pulsatile mass, no hepatomegaly, no splenomegaly EXT:  2 plus pulses throughout, no edema, no cyanosis no clubbing   EKG:  Atrial fibrillation with LBBB rate 70 10/25/2011  ASSESSMENT AND PLAN

## 2011-11-17 ENCOUNTER — Other Ambulatory Visit: Payer: Self-pay | Admitting: *Deleted

## 2011-11-17 DIAGNOSIS — I10 Essential (primary) hypertension: Secondary | ICD-10-CM

## 2011-11-17 MED ORDER — LISINOPRIL 10 MG PO TABS: 10.0000 mg | ORAL_TABLET | Freq: Every day | ORAL | Status: DC

## 2012-01-17 ENCOUNTER — Telehealth: Payer: Self-pay | Admitting: Cardiology

## 2012-01-17 NOTE — Telephone Encounter (Signed)
New msg Pt's wife was calling about getting samples of pradaxa

## 2012-01-17 NOTE — Telephone Encounter (Signed)
Not back in [place] office until 6/26.  Two boxes there - wife will pick up.

## 2012-01-26 ENCOUNTER — Other Ambulatory Visit: Payer: Self-pay | Admitting: Cardiology

## 2012-01-26 NOTE — Telephone Encounter (Signed)
..   Requested Prescriptions   Pending Prescriptions Disp Refills  . carvedilol (COREG) 6.25 MG tablet [Pharmacy Med Name: CARVEDILOL 6.25 MG TABLET] 60 tablet 9    Sig: TAKE 1 TABLET BY MOUTH 2 TIMES A DAY WITH A MEAL

## 2012-07-29 ENCOUNTER — Encounter (HOSPITAL_COMMUNITY): Payer: Self-pay | Admitting: Pharmacy Technician

## 2012-07-30 NOTE — Patient Instructions (Addendum)
Your procedure is scheduled on:  08/05/12  [facility].  Call this number if you have problems the morning of surgery: [phone]   Remember:   Do not eat or drink:After Midnight.  Take these medicines the morning of surgery with A SIP OF WATER: Carvedilol and Lisinopril   Do not wear jewelry, make-up or nail polish.  Do not wear lotions, powders, or perfumes. You may wear deodorant.  Do not shave 48 hours prior to surgery. Men may shave face and neck.  Do not bring valuables to the hospital.  Contacts, dentures or bridgework may not be worn into surgery.  Leave suitcase in the car. After surgery it may be brought to your room.  For patients admitted to the hospital, checkout time is 11:00 AM the day of discharge.   Patients discharged the day of surgery will not be allowed to drive home.    Special Instructions: Start using your eye drops before surgery as directed by your eye doctor.   Please read over the following fact sheets that you were given: Anesthesia Post-op Instructions    Cataract Surgery  A cataract is a clouding of the lens of the eye. When a lens becomes cloudy, vision is reduced based on the degree and nature of the clouding. Surgery may be needed to improve vision. Surgery removes the cloudy lens and usually replaces it with a substitute lens (intraocular lens, IOL). LET YOUR EYE DOCTOR KNOW ABOUT:  Allergies to food or medicine.  Medicines taken including herbs, eyedrops, over-the-counter medicines, and creams.  Use of steroids (by mouth or creams).  Previous problems with anesthetics or numbing medicine.  History of bleeding problems or blood clots.  Previous surgery.  Other health problems, including diabetes and kidney problems.  Possibility of pregnancy, if this applies. RISKS AND COMPLICATIONS  Infection.  Inflammation of the eyeball (endophthalmitis) that can spread to both eyes (sympathetic ophthalmia).  Poor wound  healing.  If an IOL is inserted, it can later fall out of proper position. This is very uncommon.  Clouding of the part of your eye that holds an IOL in place. This is called an "after-cataract." These are uncommon, but easily treated. BEFORE THE PROCEDURE  Do not eat or drink anything except small amounts of water for 8 to 12 before your surgery, or as directed by your caregiver.  Unless you are told otherwise, continue any eyedrops you have been prescribed.  Talk to your primary caregiver about all other medicines that you take (both prescription and non-prescription). In some cases, you may need to stop or change medicines near the time of your surgery. This is most important if you are taking blood-thinning medicine.Do not stop medicines unless you are told to do so.  Arrange for someone to drive you to and from the procedure.  Do not put contact lenses in either eye on the day of your surgery. PROCEDURE There is more than one method for safely removing a cataract. Your doctor can explain the differences and help determine which is best for you. Phacoemulsification surgery is the most common form of cataract surgery.  An injection is given behind the eye or eyedrops are given to make this a painless procedure.  A small cut (incision) is made on the edge of the clear, dome-shaped surface that covers the front of the eye (cornea).  A tiny probe is painlessly inserted into the eye. This device gives off ultrasound waves that soften and break up  the cloudy center of the lens. This makes it easier for the cloudy lens to be removed by suction.  An IOL may be implanted.  The normal lens of the eye is covered by a clear capsule. Part of that capsule is intentionally left in the eye to support the IOL.  Your surgeon may or may not use stitches to close the incision. There are other forms of cataract surgery that require a larger incision and stiches to close the eye. This approach is  taken in cases where the doctor feels that the cataract cannot be easily removed using phacoemulsification. AFTER THE PROCEDURE  When an IOL is implanted, it does not need care. It becomes a permanent part of your eye and cannot be seen or felt.  Your doctor will schedule follow-up exams to check on your progress.  Review your other medicines with your doctor to see which can be resumed after surgery.  Use eyedrops or take medicine as prescribed by your doctor. Document Released: 07/20/2011 Document Revised: 10/23/2011 Document Reviewed: 07/20/2011 [facility] Patient Information 2013 [place], [place].    PATIENT INSTRUCTIONS POST-ANESTHESIA  IMMEDIATELY FOLLOWING SURGERY:  Do not drive or operate machinery for the first twenty four hours after surgery.  Do not make any important decisions for twenty four hours after surgery or while taking narcotic pain medications or sedatives.  If you develop intractable nausea and vomiting or a severe headache please notify your doctor immediately.  FOLLOW-UP:  Please make an appointment with your surgeon as instructed. You do not need to follow up with anesthesia unless specifically instructed to do so.  WOUND CARE INSTRUCTIONS (if applicable):  Keep a dry clean dressing on the anesthesia/puncture wound site if there is drainage.  Once the wound has quit draining you may leave it open to air.  Generally you should leave the bandage intact for twenty four hours unless there is drainage.  If the epidural site drains for more than 36-48 hours please call the anesthesia department.  QUESTIONS?:  Please feel free to call your physician or the hospital operator if you have any questions, and they will be happy to assist you.

## 2012-07-31 ENCOUNTER — Encounter (HOSPITAL_COMMUNITY)
Admission: RE | Admit: 2012-07-31 | Discharge: 2012-07-31 | Disposition: A | Discharge status: Home / Self Care | Payer: Medicare Other | Source: Ambulatory Visit | Attending: Ophthalmology | Admitting: Ophthalmology

## 2012-07-31 ENCOUNTER — Encounter (HOSPITAL_COMMUNITY): Payer: Self-pay

## 2012-07-31 HISTORY — DX: Other specified postprocedural states: Z98.890

## 2012-07-31 HISTORY — DX: Nausea with vomiting, unspecified: R11.2

## 2012-07-31 HISTORY — DX: Unspecified osteoarthritis, unspecified site: M19.90

## 2012-07-31 HISTORY — DX: Essential (primary) hypertension: I10

## 2012-07-31 LAB — BASIC METABOLIC PANEL
CO2: 28 mEq/L (ref 19–32)
Calcium: 9 mg/dL (ref 8.4–10.5)
Creatinine, Ser: 1.06 mg/dL (ref 0.50–1.35)
GFR calc non Af Amer: 69 mL/min — ABNORMAL LOW (ref >90)
Glucose, Bld: 102 mg/dL — ABNORMAL HIGH (ref 70–99)
Sodium: 136 mEq/L (ref 135–145)

## 2012-08-02 MED ORDER — PHENYLEPHRINE HCL 2.5 % OP SOLN
OPHTHALMIC | Status: AC
  Filled 2012-08-02: qty 2

## 2012-08-02 MED ORDER — CYCLOPENTOLATE-PHENYLEPHRINE 0.2-1 % OP SOLN
OPHTHALMIC | Status: AC
  Filled 2012-08-02: qty 2

## 2012-08-02 MED ORDER — LIDOCAINE HCL (PF) 1 % IJ SOLN
INTRAMUSCULAR | Status: AC
  Filled 2012-08-02: qty 2

## 2012-08-02 MED ORDER — LIDOCAINE HCL 3.5 % OP GEL
OPHTHALMIC | Status: AC
  Filled 2012-08-02: qty 5

## 2012-08-02 MED ORDER — TETRACAINE HCL 0.5 % OP SOLN
OPHTHALMIC | Status: AC
  Filled 2012-08-02: qty 2

## 2012-08-02 MED ORDER — NEOMYCIN-POLYMYXIN-DEXAMETH 3.5-10000-0.1 OP OINT
TOPICAL_OINTMENT | OPHTHALMIC | Status: AC
  Filled 2012-08-02: qty 3.5

## 2012-08-05 ENCOUNTER — Encounter (HOSPITAL_COMMUNITY)
Admission: RE | Disposition: A | Discharge status: Home / Self Care | Payer: Self-pay | Source: Ambulatory Visit | Attending: Ophthalmology

## 2012-08-05 ENCOUNTER — Encounter (HOSPITAL_COMMUNITY): Payer: Self-pay | Admitting: *Deleted

## 2012-08-05 ENCOUNTER — Encounter (HOSPITAL_COMMUNITY): Payer: Self-pay | Admitting: Anesthesiology

## 2012-08-05 ENCOUNTER — Ambulatory Visit (HOSPITAL_COMMUNITY): Payer: Medicare Other | Admitting: Anesthesiology

## 2012-08-05 ENCOUNTER — Ambulatory Visit (HOSPITAL_COMMUNITY)
Admission: RE | Admit: 2012-08-05 | Discharge: 2012-08-05 | Disposition: A | Discharge status: Home / Self Care | Payer: Medicare Other | Source: Ambulatory Visit | Attending: Ophthalmology | Admitting: Ophthalmology

## 2012-08-05 DIAGNOSIS — Z01812 Encounter for preprocedural laboratory examination: Secondary | ICD-10-CM | POA: Insufficient documentation

## 2012-08-05 DIAGNOSIS — I10 Essential (primary) hypertension: Secondary | ICD-10-CM | POA: Insufficient documentation

## 2012-08-05 DIAGNOSIS — H2510 Age-related nuclear cataract, unspecified eye: Secondary | ICD-10-CM | POA: Insufficient documentation

## 2012-08-05 DIAGNOSIS — Z01810 Encounter for preprocedural cardiovascular examination: Secondary | ICD-10-CM | POA: Insufficient documentation

## 2012-08-05 HISTORY — PX: CATARACT EXTRACTION W/PHACO: SHX586

## 2012-08-05 SURGERY — PHACOEMULSIFICATION, CATARACT, WITH IOL INSERTION
Anesthesia: Monitor Anesthesia Care | Site: Eye | Laterality: Left | Wound class: Clean

## 2012-08-05 MED ORDER — LACTATED RINGERS IV SOLN
INTRAVENOUS | Status: DC
  Administered 2012-08-05: 11:00:00 via INTRAVENOUS

## 2012-08-05 MED ORDER — EPINEPHRINE HCL 1 MG/ML IJ SOLN
INTRAMUSCULAR | Status: AC
  Filled 2012-08-05: qty 1

## 2012-08-05 MED ORDER — LIDOCAINE HCL (PF) 1 % IJ SOLN
INTRAMUSCULAR | Status: DC | PRN
  Administered 2012-08-05: .4 mL

## 2012-08-05 MED ORDER — LACTATED RINGERS IV SOLN: INTRAVENOUS | Status: DC | PRN

## 2012-08-05 MED ORDER — BSS IO SOLN
INTRAOCULAR | Status: DC | PRN
  Administered 2012-08-05: 15 mL via INTRAOCULAR

## 2012-08-05 MED ORDER — MIDAZOLAM HCL 2 MG/2ML IJ SOLN
1.0000 mg | INTRAMUSCULAR | Status: DC | PRN
  Administered 2012-08-05: 2 mg via INTRAVENOUS

## 2012-08-05 MED ORDER — TETRACAINE HCL 0.5 % OP SOLN
1.0000 [drp] | OPHTHALMIC | Status: AC
  Administered 2012-08-05 (×3): 1 [drp] via OPHTHALMIC

## 2012-08-05 MED ORDER — LIDOCAINE HCL 3.5 % OP GEL
1.0000 "application " | Freq: Once | OPHTHALMIC | Status: AC
  Administered 2012-08-05: 1 via OPHTHALMIC

## 2012-08-05 MED ORDER — PHENYLEPHRINE HCL 2.5 % OP SOLN
1.0000 [drp] | OPHTHALMIC | Status: AC
  Administered 2012-08-05 (×3): 1 [drp] via OPHTHALMIC

## 2012-08-05 MED ORDER — PROVISC 10 MG/ML IO SOLN
INTRAOCULAR | Status: DC | PRN
  Administered 2012-08-05: 8.5 mg via INTRAOCULAR

## 2012-08-05 MED ORDER — EPINEPHRINE HCL 1 MG/ML IJ SOLN
INTRAOCULAR | Status: DC | PRN
  Administered 2012-08-05: 11:00:00

## 2012-08-05 MED ORDER — NEOMYCIN-POLYMYXIN-DEXAMETH 0.1 % OP OINT
TOPICAL_OINTMENT | OPHTHALMIC | Status: DC | PRN
  Administered 2012-08-05: 1 via OPHTHALMIC

## 2012-08-05 MED ORDER — CYCLOPENTOLATE-PHENYLEPHRINE 0.2-1 % OP SOLN
1.0000 [drp] | OPHTHALMIC | Status: AC
  Administered 2012-08-05 (×3): 1 [drp] via OPHTHALMIC

## 2012-08-05 MED ORDER — POVIDONE-IODINE 5 % OP SOLN
OPHTHALMIC | Status: DC | PRN
  Administered 2012-08-05: 1 via OPHTHALMIC

## 2012-08-05 MED ORDER — MIDAZOLAM HCL 2 MG/2ML IJ SOLN
INTRAMUSCULAR | Status: AC
  Filled 2012-08-05: qty 2

## 2012-08-05 SURGICAL SUPPLY — 32 items

## 2012-08-05 NOTE — Transfer of Care (Signed)
Immediate Anesthesia Transfer of Care Note  Patient: Trevor Gallagher  Procedure(s) Performed: Procedure(s) (LRB) with comments: CATARACT EXTRACTION PHACO AND INTRAOCULAR LENS PLACEMENT (IOC) (Left) - CDE: 18.08  Patient Location: Short Stay  Anesthesia Type:MAC  Level of Consciousness: awake, alert , oriented and patient cooperative  Airway & Oxygen Therapy: Patient Spontanous Breathing  Post-op Assessment: Report given to PACU RN and Post -op Vital signs reviewed and stable  Post vital signs: Reviewed and stable  Complications: No apparent anesthesia complications

## 2012-08-05 NOTE — Anesthesia Postprocedure Evaluation (Signed)
  Anesthesia Post-op Note  Patient: Trevor Gallagher  Procedure(s) Performed: Procedure(s) (LRB) with comments: CATARACT EXTRACTION PHACO AND INTRAOCULAR LENS PLACEMENT (IOC) (Left) - CDE: 18.08  Patient Location: Short Stay  Anesthesia Type:MAC  Level of Consciousness: awake, alert , oriented and patient cooperative  Airway and Oxygen Therapy: Patient Spontanous Breathing  Post-op Pain: none  Post-op Assessment: Post-op Vital signs reviewed, Patient's Cardiovascular Status Stable, Respiratory Function Stable, Patent Airway, No signs of Nausea or vomiting and Pain level controlled  Post-op Vital Signs: Reviewed and stable  Complications: No apparent anesthesia complications

## 2012-08-05 NOTE — Anesthesia Procedure Notes (Signed)
Date/Time: 08/05/2012 10:58 AM Performed by: Carolyne Littles, AMY L Pre-anesthesia Checklist: Patient identified, Patient being monitored, Emergency Drugs available, Timeout performed and Suction available Oxygen Delivery Method: Nasal cannula

## 2012-08-05 NOTE — Preoperative (Signed)
Beta Blockers   Reason not to administer Beta Blockers:Not Applicable 

## 2012-08-05 NOTE — Anesthesia Preprocedure Evaluation (Signed)
Anesthesia Evaluation  Patient identified by MRN, date of birth, ID band Patient awake    Reviewed: Allergy & Precautions, H&P , NPO status , Patient's Chart, lab work & pertinent test results  History of Anesthesia Complications (+) PONV  Airway Mallampati: I      Dental  (+) Edentulous Upper and Edentulous Lower   Pulmonary former smoker,  breath sounds clear to auscultation        Cardiovascular hypertension, Pt. on medications + dysrhythmias Atrial Fibrillation Rhythm:Irregular Rate:Normal     Neuro/Psych    GI/Hepatic   Endo/Other    Renal/GU      Musculoskeletal   Abdominal   Peds  Hematology   Anesthesia Other Findings   Reproductive/Obstetrics                           Anesthesia Physical Anesthesia Plan  ASA: III  Anesthesia Plan: MAC   Post-op Pain Management:    Induction: Intravenous  Airway Management Planned: Nasal Cannula  Additional Equipment:   Intra-op Plan:   Post-operative Plan:   Informed Consent: I have reviewed the patients History and Physical, chart, labs and discussed the procedure including the risks, benefits and alternatives for the proposed anesthesia with the patient or authorized representative who has indicated his/her understanding and acceptance.     Plan Discussed with:   Anesthesia Plan Comments:         Anesthesia Quick Evaluation  

## 2012-08-05 NOTE — Op Note (Signed)
NAMETRASHAWN, OQUENDO NO.:  [id number]  MEDICAL RECORD NO.:  [id number]  LOCATION:  [facility]                          FACILITY:  [facility]  PHYSICIAN:  Susanne Greenhouse, MD       DATE OF BIRTH:  May 02, 1941  DATE OF PROCEDURE:  08/05/2012 DATE OF DISCHARGE:  08/05/2012                              OPERATIVE REPORT   PREOPERATIVE DIAGNOSIS:  Nuclear cataract, left eye, diagnosis code 366.16.  POSTOPERATIVE DIAGNOSIS:  Nuclear cataract, left eye, diagnosis code 366.16.  OPERATION PERFORMED:  Phacoemulsification with posterior chamber intraocular lens implantation, left eye.  SURGEON:  Bonne Dolores. Erasmus Bistline, MD.  ANESTHESIA:  Topical with monitored anesthesia care and IV sedation.  OPERATIVE SUMMARY:  In the preoperative area, dilating drops were placed into the left eye.  The patient was then brought into the operating room where he was placed under topical anesthesia and IV sedation.  The eye was then prepped and draped.  Beginning with a 75 blade, a paracentesis port was made at the surgeon's 2 o'clock position.  The anterior chamber was then filled with a 1% nonpreserved lidocaine solution with epinephrine.  This was followed by Viscoat to deepen the chamber.  A small fornix-based peritomy was performed superiorly.  Next, a single iris hook was placed through the limbus superiorly.  A 2.4-mm keratome blade was then used to make a clear corneal incision over the iris hook. A bent cystotome needle and Utrata forceps were used to create a continuous tear capsulotomy.  Hydrodissection was performed using balanced salt solution on a fine cannula.  The lens nucleus was then removed using phacoemulsification in a quadrant cracking technique.  The cortical material was then removed with irrigation and aspiration.  The capsular bag and anterior chamber were refilled with Provisc.  The wound was widened to approximately 3 mm and a posterior chamber intraocular lens was placed into the  capsular bag without difficulty using an [employer] lens injecting system.  A single 10-0 nylon suture was then used to close the incision as well as stromal hydration.  The Provisc was removed from the anterior chamber and capsular bag with irrigation and aspiration.  At this point, the wounds were tested for leak, which were negative.  The anterior chamber remained deep and stable.  The patient tolerated the procedure well.  There were no operative complications, and he awoke from topical anesthesia and IV sedation without problem.  No surgical specimens.  Prosthetic device used is a Theme park manager, model EnVista, model number MX60, power of 24.0, serial number is [id number].          ______________________________ Susanne Greenhouse, MD     KEH/MEDQ  D:  08/05/2012  T:  08/05/2012  Job:  [id number]

## 2012-08-05 NOTE — Brief Op Note (Signed)
Pre-Op Dx: Cataract OS Post-Op Dx: Cataract OS Surgeon: Sissy Goetzke Anesthesia: Topical with MAC Surgery: Cataract Extraction with Intraocular lens Implant OS Implant: B&L enVista Specimen: None Complications: None 

## 2012-08-05 NOTE — H&P (Signed)
I have reviewed the H&P, the patient was re-examined, and I have identified no interval changes in medical condition and plan of care since the history and physical of record  

## 2012-08-12 ENCOUNTER — Encounter (HOSPITAL_COMMUNITY): Payer: Self-pay | Admitting: Ophthalmology

## 2012-09-05 ENCOUNTER — Encounter (HOSPITAL_COMMUNITY): Payer: Self-pay | Admitting: Pharmacy Technician

## 2012-09-11 ENCOUNTER — Encounter (HOSPITAL_COMMUNITY)
Admission: RE | Admit: 2012-09-11 | Discharge: 2012-09-11 | Disposition: A | Discharge status: Home / Self Care | Payer: Medicare Other | Source: Ambulatory Visit | Attending: Ophthalmology | Admitting: Ophthalmology

## 2012-09-11 ENCOUNTER — Encounter (HOSPITAL_COMMUNITY): Payer: Self-pay

## 2012-09-11 ENCOUNTER — Telehealth: Payer: Self-pay | Admitting: Cardiology

## 2012-09-11 MED ORDER — FENTANYL CITRATE 0.05 MG/ML IJ SOLN: 25.0000 ug | INTRAMUSCULAR | Status: AC | PRN

## 2012-09-11 MED ORDER — ONDANSETRON HCL 4 MG/2ML IJ SOLN: 4.0000 mg | Freq: Once | INTRAMUSCULAR | Status: AC | PRN

## 2012-09-11 NOTE — Telephone Encounter (Signed)
Pt needs samples of pradaxa and want to pick it up in [place] office

## 2012-09-11 NOTE — Telephone Encounter (Signed)
Samples taken to the [facility] last week and left for pt.  Wife aware

## 2012-09-12 ENCOUNTER — Inpatient Hospital Stay (HOSPITAL_COMMUNITY): Admission: RE | Admit: 2012-09-12 | Payer: Medicare Other | Source: Ambulatory Visit

## 2012-09-18 MED ORDER — CYCLOPENTOLATE-PHENYLEPHRINE 0.2-1 % OP SOLN
OPHTHALMIC | Status: AC
  Filled 2012-09-18: qty 2

## 2012-09-18 MED ORDER — TETRACAINE HCL 0.5 % OP SOLN
OPHTHALMIC | Status: AC
  Filled 2012-09-18: qty 2

## 2012-09-18 MED ORDER — PHENYLEPHRINE HCL 2.5 % OP SOLN
OPHTHALMIC | Status: AC
  Filled 2012-09-18: qty 2

## 2012-09-18 MED ORDER — NEOMYCIN-POLYMYXIN-DEXAMETH 3.5-10000-0.1 OP OINT
TOPICAL_OINTMENT | OPHTHALMIC | Status: AC
  Filled 2012-09-18: qty 3.5

## 2012-09-18 MED ORDER — LIDOCAINE HCL (PF) 1 % IJ SOLN
INTRAMUSCULAR | Status: AC
  Filled 2012-09-18: qty 2

## 2012-09-18 MED ORDER — LIDOCAINE HCL 3.5 % OP GEL
OPHTHALMIC | Status: AC
  Filled 2012-09-18: qty 5

## 2012-09-19 ENCOUNTER — Encounter (HOSPITAL_COMMUNITY): Payer: Self-pay | Admitting: Anesthesiology

## 2012-09-19 ENCOUNTER — Ambulatory Visit (HOSPITAL_COMMUNITY)
Admission: RE | Admit: 2012-09-19 | Discharge: 2012-09-19 | Disposition: A | Discharge status: Home / Self Care | Payer: Medicare Other | Source: Ambulatory Visit | Attending: Ophthalmology | Admitting: Ophthalmology

## 2012-09-19 ENCOUNTER — Ambulatory Visit (HOSPITAL_COMMUNITY): Payer: Medicare Other | Admitting: Anesthesiology

## 2012-09-19 ENCOUNTER — Encounter (HOSPITAL_COMMUNITY)
Admission: RE | Disposition: A | Discharge status: Home / Self Care | Payer: Self-pay | Source: Ambulatory Visit | Attending: Ophthalmology

## 2012-09-19 ENCOUNTER — Encounter (HOSPITAL_COMMUNITY): Payer: Self-pay | Admitting: *Deleted

## 2012-09-19 DIAGNOSIS — H2510 Age-related nuclear cataract, unspecified eye: Secondary | ICD-10-CM | POA: Insufficient documentation

## 2012-09-19 DIAGNOSIS — I10 Essential (primary) hypertension: Secondary | ICD-10-CM | POA: Insufficient documentation

## 2012-09-19 DIAGNOSIS — Z79899 Other long term (current) drug therapy: Secondary | ICD-10-CM | POA: Insufficient documentation

## 2012-09-19 HISTORY — PX: CATARACT EXTRACTION W/PHACO: SHX586

## 2012-09-19 SURGERY — PHACOEMULSIFICATION, CATARACT, WITH IOL INSERTION
Anesthesia: Monitor Anesthesia Care | Site: Eye | Laterality: Right | Wound class: Clean

## 2012-09-19 MED ORDER — MIDAZOLAM HCL 2 MG/2ML IJ SOLN
INTRAMUSCULAR | Status: AC
  Filled 2012-09-19: qty 2

## 2012-09-19 MED ORDER — LIDOCAINE HCL 3.5 % OP GEL
1.0000 "application " | Freq: Once | OPHTHALMIC | Status: AC
  Administered 2012-09-19: 1 via OPHTHALMIC

## 2012-09-19 MED ORDER — NEOMYCIN-POLYMYXIN-DEXAMETH 0.1 % OP OINT
TOPICAL_OINTMENT | OPHTHALMIC | Status: DC | PRN
  Administered 2012-09-19: 1 via OPHTHALMIC

## 2012-09-19 MED ORDER — BSS IO SOLN
INTRAOCULAR | Status: DC | PRN
  Administered 2012-09-19: 15 mL via INTRAOCULAR

## 2012-09-19 MED ORDER — EPINEPHRINE HCL 1 MG/ML IJ SOLN
INTRAOCULAR | Status: DC | PRN
  Administered 2012-09-19: 10:00:00

## 2012-09-19 MED ORDER — TETRACAINE HCL 0.5 % OP SOLN
1.0000 [drp] | OPHTHALMIC | Status: AC
  Administered 2012-09-19 (×3): 1 [drp] via OPHTHALMIC

## 2012-09-19 MED ORDER — CYCLOPENTOLATE-PHENYLEPHRINE 0.2-1 % OP SOLN
1.0000 [drp] | OPHTHALMIC | Status: AC
  Administered 2012-09-19 (×3): 1 [drp] via OPHTHALMIC

## 2012-09-19 MED ORDER — PROVISC 10 MG/ML IO SOLN
INTRAOCULAR | Status: DC | PRN
  Administered 2012-09-19: 8.5 mg via INTRAOCULAR

## 2012-09-19 MED ORDER — LIDOCAINE 3.5 % OP GEL OPTIME - NO CHARGE
OPHTHALMIC | Status: DC | PRN
  Administered 2012-09-19: 1 [drp] via OPHTHALMIC

## 2012-09-19 MED ORDER — LIDOCAINE HCL (PF) 1 % IJ SOLN
INTRAMUSCULAR | Status: DC | PRN
  Administered 2012-09-19: .3 mL

## 2012-09-19 MED ORDER — MIDAZOLAM HCL 2 MG/2ML IJ SOLN
1.0000 mg | INTRAMUSCULAR | Status: DC | PRN
  Administered 2012-09-19: 2 mg via INTRAVENOUS

## 2012-09-19 MED ORDER — PHENYLEPHRINE HCL 2.5 % OP SOLN
1.0000 [drp] | OPHTHALMIC | Status: AC
  Administered 2012-09-19 (×3): 1 [drp] via OPHTHALMIC

## 2012-09-19 MED ORDER — LACTATED RINGERS IV SOLN
INTRAVENOUS | Status: DC
  Administered 2012-09-19: 1000 mL via INTRAVENOUS

## 2012-09-19 MED ORDER — POVIDONE-IODINE 5 % OP SOLN
OPHTHALMIC | Status: DC | PRN
  Administered 2012-09-19: 1 via OPHTHALMIC

## 2012-09-19 SURGICAL SUPPLY — 32 items

## 2012-09-19 NOTE — Anesthesia Postprocedure Evaluation (Signed)
  Anesthesia Post-op Note  Patient: Trevor Gallagher  Procedure(s) Performed: Procedure(s) (LRB) with comments: CATARACT EXTRACTION PHACO AND INTRAOCULAR LENS PLACEMENT (IOC) (Right) - CDE: 16.03  Patient Location: Short Stay  Anesthesia Type:MAC  Level of Consciousness: awake, alert , oriented and patient cooperative  Airway and Oxygen Therapy: Patient Spontanous Breathing  Post-op Pain: none  Post-op Assessment: Post-op Vital signs reviewed, Patient's Cardiovascular Status Stable, Respiratory Function Stable, Patent Airway and Pain level controlled  Post-op Vital Signs: Reviewed and stable  Complications: No apparent anesthesia complications

## 2012-09-19 NOTE — Transfer of Care (Signed)
Immediate Anesthesia Transfer of Care Note  Patient: Trevor Gallagher  Procedure(s) Performed: Procedure(s) (LRB) with comments: CATARACT EXTRACTION PHACO AND INTRAOCULAR LENS PLACEMENT (IOC) (Right) - CDE: 16.03  Patient Location: Short Stay  Anesthesia Type:MAC  Level of Consciousness: awake, alert , oriented and patient cooperative  Airway & Oxygen Therapy: Patient Spontanous Breathing  Post-op Assessment: Report given to PACU RN and Post -op Vital signs reviewed and stable  Post vital signs: Reviewed and stable  Complications: No apparent anesthesia complications

## 2012-09-19 NOTE — H&P (Signed)
I have reviewed the H&P, the patient was re-examined, and I have identified no interval changes in medical condition and plan of care since the history and physical of record  

## 2012-09-19 NOTE — Anesthesia Preprocedure Evaluation (Signed)
Anesthesia Evaluation  Patient identified by MRN, date of birth, ID band Patient awake    Reviewed: Allergy & Precautions, H&P , NPO status , Patient's Chart, lab work & pertinent test results  History of Anesthesia Complications (+) PONV  Airway Mallampati: I      Dental  (+) Edentulous Upper and Edentulous Lower   Pulmonary former smoker,  breath sounds clear to auscultation        Cardiovascular hypertension, Pt. on medications + dysrhythmias Atrial Fibrillation Rhythm:Irregular Rate:Normal     Neuro/Psych    GI/Hepatic   Endo/Other    Renal/GU      Musculoskeletal   Abdominal   Peds  Hematology   Anesthesia Other Findings   Reproductive/Obstetrics                           Anesthesia Physical Anesthesia Plan  ASA: III  Anesthesia Plan: MAC   Post-op Pain Management:    Induction: Intravenous  Airway Management Planned: Nasal Cannula  Additional Equipment:   Intra-op Plan:   Post-operative Plan:   Informed Consent: I have reviewed the patients History and Physical, chart, labs and discussed the procedure including the risks, benefits and alternatives for the proposed anesthesia with the patient or authorized representative who has indicated his/her understanding and acceptance.     Plan Discussed with:   Anesthesia Plan Comments:         Anesthesia Quick Evaluation

## 2012-09-19 NOTE — Brief Op Note (Signed)
Pre-Op Dx: Cataract OD Post-Op Dx: Cataract OD Surgeon: Mariane Burpee Anesthesia: Topical with MAC Surgery: Cataract Extraction with Intraocular lens Implant OD Implant: B&L enVista Specimen: None Complications: None 

## 2012-09-19 NOTE — Addendum Note (Signed)
Addendum  created 09/19/12 1213 by Franco Nones, CRNA   Modules edited:Charges VN

## 2012-09-20 ENCOUNTER — Encounter (HOSPITAL_COMMUNITY): Payer: Self-pay | Admitting: Ophthalmology

## 2012-09-20 NOTE — Op Note (Signed)
NAMEELDER, Trevor NO.:  [id number]  MEDICAL RECORD NO.:  [id number]  LOCATION:  [facility]                          FACILITY:  [facility]  PHYSICIAN:  Susanne Greenhouse, MD       DATE OF BIRTH:  [birth year]  DATE OF PROCEDURE:  09/19/2012 DATE OF DISCHARGE:  09/19/2012                              OPERATIVE REPORT   PREOPERATIVE DIAGNOSIS:  Nuclear cataract, right eye, diagnosis code 366.16.  POSTOPERATIVE DIAGNOSIS:  Nuclear cataract, right eye, diagnosis code 366.16.  OPERATION PERFORMED:  Phacoemulsification with posterior chamber intraocular lens implantation, right eye.  SURGEON:  Bonne Dolores. Taraann Olthoff, MD  ANESTHESIA:  Topical with monitored anesthesia care and IV sedation.  OPERATIVE SUMMARY:  In the preoperative area, dilating drops were placed into the right eye.  The patient was then brought into the operating room where he was placed under topical anesthesia and IV sedation.  The eye was then prepped and draped.  Beginning with a 75 blade, a paracentesis port was made at the surgeon's 2 o'clock position.  The anterior chamber was then filled with a 1% nonpreserved lidocaine solution with epinephrine.  This was followed by Viscoat to deepen the chamber.  A small fornix-based peritomy was performed superiorly.  Next, a single iris hook was placed through the limbus superiorly.  A 2.4-mm keratome blade was then used to make a clear corneal incision over the iris hook.  A bent cystotome needle and Utrata forceps were used to create a continuous tear capsulotomy.  Hydrodissection was performed using balanced salt solution on a fine cannula.  The lens nucleus was then removed using phacoemulsification in a quadrant cracking technique. The cortical material was then removed with irrigation and aspiration. The capsular bag and anterior chamber were refilled with Provisc.  The wound was widened to approximately 3 mm and a posterior chamber intraocular lens was placed into  the capsular bag without difficulty using an [employer] lens injecting system.  A single 10-0 nylon suture was then used to close the incision as well as stromal hydration. The Provisc was removed from the anterior chamber and capsular bag with irrigation and aspiration.  At this point, the wounds were tested for leak, which were negative.  The anterior chamber remained deep and stable.  The patient tolerated the procedure well.  There were no operative complications, and he awoke from topical anesthesia and IV sedation without problem.  No surgical specimens.  Prosthetic device used is a Theme park manager, model EnVista, model number MX60, power of 22.5, serial number is [id number].          ______________________________ Susanne Greenhouse, MD     KEH/MEDQ  D:  09/19/2012  T:  09/20/2012  Job:  [id number]

## 2012-10-16 ENCOUNTER — Ambulatory Visit (INDEPENDENT_AMBULATORY_CARE_PROVIDER_SITE_OTHER): Payer: Medicare Other | Admitting: Cardiology

## 2012-10-16 ENCOUNTER — Encounter: Payer: Self-pay | Admitting: Cardiology

## 2012-10-16 VITALS — BP 135/84 | HR 87 | Ht 73.0 in | Wt 225.0 lb

## 2012-10-16 DIAGNOSIS — I10 Essential (primary) hypertension: Secondary | ICD-10-CM

## 2012-10-16 DIAGNOSIS — I4891 Unspecified atrial fibrillation: Secondary | ICD-10-CM

## 2012-10-16 NOTE — Patient Instructions (Addendum)
The current medical regimen is effective;  continue present plan and medications.  Your physician has requested that you have an echocardiogram in September 2014. Echocardiography is a painless test that uses sound waves to create images of your heart. It provides your doctor with information about the size and shape of your heart and how well your heart's chambers and valves are working. This procedure takes approximately one hour. There are no restrictions for this procedure.  Follow up in 1 year with Dr Antoine Poche.  You will receive a letter in the mail 2 months before you are due.  Please call us when you receive this letter to schedule your follow up appointment.

## 2012-10-16 NOTE — Progress Notes (Signed)
HPI The patient presents for followup of atrial fibrillation and a nonischemic cardiomyopathy. Since I last saw him he has had no new cardiovascular symptoms. 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  The patient denies any new symptoms such as chest discomfort, neck or arm discomfort. There has been no new shortness of breath, PND or orthopnea. There have been no reported palpitations, presyncope or syncope.  He works very hard without significant limitations.   No Known Allergies  Current Outpatient Prescriptions  Medication Sig Dispense Refill  . acetaminophen (TYLENOL) 500 MG tablet Take 500 mg by mouth every 6 (six) hours as needed. Pain.      . carvedilol (COREG) 6.25 MG tablet TAKE 1 TABLET BY MOUTH 2 TIMES A DAY WITH A MEAL  60 tablet  9  . dabigatran (PRADAXA) 150 MG CAPS Take 1 capsule (150 mg total) by mouth every 12 (twelve) hours.  60 capsule  11  . lisinopril (PRINIVIL,ZESTRIL) 10 MG tablet Take 1 tablet (10 mg total) by mouth daily.  30 tablet  11   No current facility-administered medications for this visit.   Facility-Administered Medications Ordered in Other Visits  Medication Dose Route Frequency Leen Tworek Last Rate Last Dose  . fentaNYL (SUBLIMAZE) injection 25-50 mcg  25-50 mcg Intravenous Q5 min PRN Laurene Footman, MD        Past Medical History  Diagnosis Date  . Nephrolithiasis   . Cardiomyopathy   . PONV (postoperative nausea and vomiting)   . Atrial fibrillation     s/p cardioversion -- recurrent  . Hypertension   . Arthritis     Past Surgical History  Procedure Laterality Date  . Carpal tunnel release      left  . Inguinal hernia repair      bilateral  . Herniated nucleus pulposus      L4-L5  . Cataract extraction w/phaco  08/05/2012    Procedure: CATARACT EXTRACTION PHACO AND INTRAOCULAR LENS PLACEMENT (IOC);  Surgeon: Gemma Payor, MD;  Location: [facility];  Service: Ophthalmology;  Laterality: Left;  CDE: 18.08  . Cataract  extraction w/phaco  09/19/2012    Procedure: CATARACT EXTRACTION PHACO AND INTRAOCULAR LENS PLACEMENT (IOC);  Surgeon: Gemma Payor, MD;  Location: [facility];  Service: Ophthalmology;  Laterality: Right;  CDE: 16.03   ROS: As stated in the HPI and negative for all other systems.  PHYSICAL EXAM BP 135/84  Pulse 87  Ht 6\' 1"  (1.854 m)  Wt 225 lb (102.059 kg)  BMI 29.69 kg/m2 GENERAL:  Well appearing HEENT:  Pupils equal round and reactive, fundi not visualized, oral mucosa unremarkable, edentulous NECK:  No jugular venous distention, waveform within normal limits, carotid upstroke brisk and symmetric, no bruits, no thyromegaly LUNGS:  Clear to auscultation bilaterally BACK:  No CVA tenderness CHEST:  Unremarkable HEART:  PMI not displaced or sustained,S1 and S2 within normal limits, no S3, no clicks, no rubs, no murmurs, irregular ABD:  Flat, positive bowel sounds normal in frequency in pitch, no bruits, no rebound, no guarding, no midline pulsatile mass, no hepatomegaly, no splenomegaly EXT:  2 plus pulses throughout, no edema, no cyanosis no clubbing    ASSESSMENT AND PLAN  CARDIOMYOPATHY:  He seems to be euvolemic. I will check an echocardiogram in September which will be 2 years from previous. He will otherwise continue on the meds as listed.  ATRIAL FIBRILLATION:  The patient  tolerates this rhythm and rate control and anticoagulation. We will  continue with the meds as listed.  HTN:  The blood pressure is at target. No change in medications is indicated. We will continue with therapeutic lifestyle changes (TLC).

## 2012-11-03 ENCOUNTER — Other Ambulatory Visit: Payer: Self-pay | Admitting: Cardiology

## 2012-11-26 ENCOUNTER — Other Ambulatory Visit: Payer: Self-pay | Admitting: Cardiology

## 2013-04-09 ENCOUNTER — Ambulatory Visit (INDEPENDENT_AMBULATORY_CARE_PROVIDER_SITE_OTHER): Payer: Medicare Other | Admitting: General Practice

## 2013-04-09 VITALS — BP 144/98 | HR 87 | Temp 97.0°F | Ht 73.0 in | Wt 230.0 lb

## 2013-04-09 DIAGNOSIS — H6690 Otitis media, unspecified, unspecified ear: Secondary | ICD-10-CM

## 2013-04-09 DIAGNOSIS — H6692 Otitis media, unspecified, left ear: Secondary | ICD-10-CM

## 2013-04-09 MED ORDER — AMOXICILLIN 500 MG PO CAPS: 500.0000 mg | ORAL_CAPSULE | Freq: Two times a day (BID) | ORAL | Status: DC

## 2013-04-09 NOTE — Progress Notes (Signed)
  Subjective:    Patient ID: Trevor Gallagher, male    DOB: [birth year], 72 y.o.   MRN: [id number]  Otalgia  There is pain in the left ear. This is a new problem. The current episode started in the past 7 days. The problem occurs constantly. The problem has been unchanged. There has been no fever. Associated symptoms include drainage. Pertinent negatives include no abdominal pain, coughing, diarrhea, headaches, hearing loss, neck pain, rhinorrhea, sore throat or vomiting. He has tried nothing for the symptoms.       Review of Systems  Constitutional: Negative for fever and chills.  HENT: Positive for ear pain. Negative for hearing loss, congestion, sore throat, rhinorrhea, neck pain and neck stiffness.        Left ear  Respiratory: Negative for cough.   Gastrointestinal: Negative for vomiting, abdominal pain and diarrhea.  Neurological: Negative for dizziness, weakness and headaches.       Objective:   Physical Exam  Constitutional: He is oriented to person, place, and time. He appears well-developed and well-nourished.  HENT:  Head: Normocephalic and atraumatic.  Right Ear: External ear normal.  Left Ear: Tympanic membrane is erythematous.  Nose: Nose normal.  Mouth/Throat: Oropharynx is clear and moist.  Cardiovascular: Normal rate, regular rhythm and normal heart sounds.   Pulmonary/Chest: Effort normal and breath sounds normal.  Neurological: He is alert and oriented to person, place, and time.  Skin: Skin is warm and dry.  Psychiatric: He has a normal mood and affect.          Assessment & Plan:  1. Otitis media, left - amoxicillin (AMOXIL) 500 MG capsule; Take 1 capsule (500 mg total) by mouth 2 (two) times daily.  Dispense: 20 capsule; Refill: 0 -discussed hypertension, healthy eating, regular exercise -discussed importance of having routine follow ups -RTO if symptoms worsen or unresolved -follow up scheduled for Wednesday, Sept 10, 2014  -Patient verbalized  understanding Coralie Keens, FNP-C

## 2013-04-09 NOTE — Patient Instructions (Addendum)

## 2013-04-11 ENCOUNTER — Other Ambulatory Visit: Payer: Self-pay | Admitting: *Deleted

## 2013-04-11 DIAGNOSIS — I4891 Unspecified atrial fibrillation: Secondary | ICD-10-CM

## 2013-04-11 MED ORDER — DABIGATRAN ETEXILATE MESYLATE 150 MG PO CAPS: 150.0000 mg | ORAL_CAPSULE | Freq: Two times a day (BID) | ORAL | Status: DC

## 2013-04-23 ENCOUNTER — Ambulatory Visit: Payer: Medicare Other | Admitting: General Practice

## 2013-04-30 ENCOUNTER — Other Ambulatory Visit (INDEPENDENT_AMBULATORY_CARE_PROVIDER_SITE_OTHER): Payer: Medicare Other

## 2013-04-30 ENCOUNTER — Other Ambulatory Visit: Payer: Self-pay

## 2013-04-30 DIAGNOSIS — I428 Other cardiomyopathies: Secondary | ICD-10-CM

## 2013-04-30 DIAGNOSIS — I4891 Unspecified atrial fibrillation: Secondary | ICD-10-CM

## 2013-04-30 DIAGNOSIS — I447 Left bundle-branch block, unspecified: Secondary | ICD-10-CM

## 2013-09-25 ENCOUNTER — Other Ambulatory Visit: Payer: Self-pay | Admitting: Cardiology

## 2013-10-31 ENCOUNTER — Other Ambulatory Visit: Payer: Self-pay | Admitting: *Deleted

## 2013-10-31 MED ORDER — CARVEDILOL 6.25 MG PO TABS: ORAL_TABLET | ORAL | Status: AC

## 2013-11-27 ENCOUNTER — Other Ambulatory Visit: Payer: Self-pay

## 2013-11-27 MED ORDER — LISINOPRIL 10 MG PO TABS: ORAL_TABLET | ORAL | Status: AC

## 2013-12-10 ENCOUNTER — Ambulatory Visit (INDEPENDENT_AMBULATORY_CARE_PROVIDER_SITE_OTHER): Payer: Medicare Other | Admitting: Cardiology

## 2013-12-10 ENCOUNTER — Encounter: Payer: Self-pay | Admitting: Cardiology

## 2013-12-10 VITALS — BP 100/78 | HR 93 | Ht 73.0 in | Wt 224.0 lb

## 2013-12-10 DIAGNOSIS — I428 Other cardiomyopathies: Secondary | ICD-10-CM

## 2013-12-10 DIAGNOSIS — I447 Left bundle-branch block, unspecified: Secondary | ICD-10-CM

## 2013-12-10 DIAGNOSIS — I4891 Unspecified atrial fibrillation: Secondary | ICD-10-CM

## 2013-12-10 DIAGNOSIS — I10 Essential (primary) hypertension: Secondary | ICD-10-CM

## 2013-12-10 NOTE — Progress Notes (Signed)
HPI The patient presents for followup of atrial fibrillation and a nonischemic cardiomyopathy. Since I last saw him he has had no new cardiovascular symptoms. He says he is breathing well unless.    The patient denies any new symptoms such as chest discomfort, neck or arm discomfort. There has been no new shortness of breath, PND or orthopnea. There have been no reported palpitations, presyncope or syncope.  He works very hard without significant limitations.  He is bothered by knee pains.    He did have an echo last fall with mild AS and an EF unchanged at about 35%.   No Known Allergies  Current Outpatient Prescriptions  Medication Sig Dispense Refill  . acetaminophen (TYLENOL) 500 MG tablet Take 500 mg by mouth every 6 (six) hours as needed. Pain.      . carvedilol (COREG) 6.25 MG tablet TAKE 1 TABLET BY MOUTH 2 TIMES A DAY WITH A MEAL  60 tablet  1  . dabigatran (PRADAXA) 150 MG CAPS capsule Take 150 mg by mouth daily.      Marland Kitchen lisinopril (PRINIVIL,ZESTRIL) 10 MG tablet TAKE 1 TABLET (10 MG TOTAL) BY MOUTH DAILY.  30 tablet  1   No current facility-administered medications for this visit.   Facility-Administered Medications Ordered in Other Visits  Medication Dose Route Frequency Provider Last Rate Last Dose  . fentaNYL (SUBLIMAZE) injection 25-50 mcg  25-50 mcg Intravenous Q5 min PRN Laurene Footman, MD        Past Medical History  Diagnosis Date  . Nephrolithiasis   . Cardiomyopathy   . PONV (postoperative nausea and vomiting)   . Atrial fibrillation     s/p cardioversion -- recurrent  . Hypertension   . Arthritis     Past Surgical History  Procedure Laterality Date  . Carpal tunnel release      left  . Inguinal hernia repair      bilateral  . Herniated nucleus pulposus      L4-L5  . Cataract extraction w/phaco  08/05/2012    Procedure: CATARACT EXTRACTION PHACO AND INTRAOCULAR LENS PLACEMENT (IOC);  Surgeon: Gemma Payor, MD;  Location: [facility];  Service: Ophthalmology;   Laterality: Left;  CDE: 18.08  . Cataract extraction w/phaco  09/19/2012    Procedure: CATARACT EXTRACTION PHACO AND INTRAOCULAR LENS PLACEMENT (IOC);  Surgeon: Gemma Payor, MD;  Location: [facility];  Service: Ophthalmology;  Laterality: Right;  CDE: 16.03   ROS: Swelling over his left eye.  Otherwise as stated in the HPI and negative for all other systems.  PHYSICAL EXAM BP 100/78  Pulse 93  Ht 6\' 1"  (1.854 m)  Wt 224 lb (101.606 kg)  BMI 29.56 kg/m2 GENERAL:  Well appearing HEENT:  Pupils equal round and reactive, fundi not visualized, oral mucosa unremarkable, edentulous, swelling over the right eye. NECK:  No jugular venous distention, waveform within normal limits, carotid upstroke brisk and symmetric, no bruits, no thyromegaly LUNGS:  Clear to auscultation bilaterally BACK:  No CVA tenderness CHEST:  Unremarkable HEART:  PMI not displaced or sustained,S1 and S2 within normal limits, no S3, no clicks, no rubs, no murmurs, irregular ABD:  Flat, positive bowel sounds normal in frequency in pitch, no bruits, no rebound, no guarding, no midline pulsatile mass, no hepatomegaly, no splenomegaly EXT:  2 plus pulses throughout, no edema, no cyanosis no clubbing   EKG:  Atrial fibrillation with LBBB. No change from previous.  Rate 93.  12/10/2013  ASSESSMENT AND PLAN  CARDIOMYOPATHY:  He seems to be euvolemic. His EF is reduced but stable.  No change in therapy is indicated.   ATRIAL FIBRILLATION:  The patient  tolerates this rhythm and rate control and anticoagulation. We will continue with the meds as listed.  HTN:  The blood pressure is at target. No change in medications is indicated. We will continue with therapeutic lifestyle changes (TLC).

## 2013-12-10 NOTE — Patient Instructions (Signed)
The current medical regimen is effective;  continue present plan and medications.  Follow up in 1 year with Dr Hochrein.  You will receive a letter in the mail 2 months before you are due.  Please call us when you receive this letter to schedule your follow up appointment.  

## 2013-12-27 ENCOUNTER — Telehealth: Payer: Self-pay | Admitting: Physician Assistant

## 2013-12-31 ENCOUNTER — Telehealth: Payer: Self-pay | Admitting: Cardiology

## 2013-12-31 NOTE — Telephone Encounter (Signed)
Original D/C rec Via Mail.

## 2014-01-06 ENCOUNTER — Telehealth: Payer: Self-pay | Admitting: Cardiology

## 2014-01-06 NOTE — Telephone Encounter (Signed)
Dr.Hochrein Signed Original D/C Mailed to  [facility] [address]  [facility] [address] [phone]

## 2014-01-12 NOTE — Telephone Encounter (Signed)
I received a telephone call from EMS this morning.  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  CPR was performed for with multiple shocks and Epi administration without success.  Wilburt Finlay, [facility]

## 2014-01-12 DEATH — deceased
